# Patient Record
Sex: Male | Born: 1935 | Race: White | Hispanic: No | Marital: Married | Smoking: Never smoker
Health system: Southern US, Community
[De-identification: ages and names within clinical notes are randomized; demographics above are authoritative.]

## PROBLEM LIST (undated history)

## (undated) DIAGNOSIS — E669 Obesity, unspecified: Secondary | ICD-10-CM

## (undated) DIAGNOSIS — E785 Hyperlipidemia, unspecified: Secondary | ICD-10-CM

## (undated) DIAGNOSIS — I10 Essential (primary) hypertension: Secondary | ICD-10-CM

## (undated) DIAGNOSIS — R7989 Other specified abnormal findings of blood chemistry: Secondary | ICD-10-CM

## (undated) HISTORY — DX: Obesity, unspecified: E66.9

## (undated) HISTORY — PX: KNEE SURGERY: SHX244

## (undated) HISTORY — PX: APPENDECTOMY: SHX54

## (undated) HISTORY — DX: Hyperlipidemia, unspecified: E78.5

## (undated) HISTORY — DX: Other specified abnormal findings of blood chemistry: R79.89

## (undated) HISTORY — DX: Essential (primary) hypertension: I10

---

## 2009-06-21 ENCOUNTER — Encounter: Payer: Self-pay | Admitting: Cardiology

## 2010-02-05 ENCOUNTER — Encounter: Payer: Self-pay | Admitting: Cardiology

## 2010-02-28 DIAGNOSIS — R519 Headache, unspecified: Secondary | ICD-10-CM | POA: Insufficient documentation

## 2010-02-28 DIAGNOSIS — R5383 Other fatigue: Secondary | ICD-10-CM | POA: Insufficient documentation

## 2010-02-28 DIAGNOSIS — R51 Headache: Secondary | ICD-10-CM | POA: Insufficient documentation

## 2010-02-28 DIAGNOSIS — R4182 Altered mental status, unspecified: Secondary | ICD-10-CM | POA: Insufficient documentation

## 2010-02-28 DIAGNOSIS — R42 Dizziness and giddiness: Secondary | ICD-10-CM | POA: Insufficient documentation

## 2010-03-01 ENCOUNTER — Ambulatory Visit: Payer: Self-pay | Admitting: Cardiology

## 2010-03-01 DIAGNOSIS — M542 Cervicalgia: Secondary | ICD-10-CM | POA: Insufficient documentation

## 2010-03-01 DIAGNOSIS — E669 Obesity, unspecified: Secondary | ICD-10-CM

## 2010-03-01 DIAGNOSIS — E663 Overweight: Secondary | ICD-10-CM | POA: Insufficient documentation

## 2010-03-01 DIAGNOSIS — I10 Essential (primary) hypertension: Secondary | ICD-10-CM

## 2010-07-13 NOTE — Assessment & Plan Note (Signed)
Summary: np6/weak spells/eval for treadmill & echo/jml   Visit Type:  Initial Consult Primary Provider:  Dr. Barrie Lyme  CC:  Neck Pain.  History of Present Illness: The the patient this patient of neck discomfort. He saw his primary doctor and discuss some vague discomfort in his right posterior neck. This it was a pulling  kind of tired sensation. He couldn't quantify or qualify it. He had a friend tell him that if his fingers became numb he should become concerned and he did have some tingling in his right fifth and fourth digits. However, none of this was reproducible. He has not had any chest pressure. He has had no associated symptoms such as nausea vomiting or diaphoresis. He has no shortness of breath and denies any PND or orthopnea. He denies any palpitations, presyncope or syncope. He says that he is somewhat active running a chain saw and hunting. With this level of activity he cannot bring on any symptoms. He says he had a treadmill test some years ago but no other cardiovascular testing.  Preventive Screening-Counseling & Management  Alcohol-Tobacco     Smoking Status: quit     Year Quit: 1970  Current Medications (verified): 1)  Niaspan 500 Mg Cr-Tabs (Niacin (Antihyperlipidemic)) .... Take 2 Tablet By Mouth Once A Day 2)  Lisinopril 20 Mg Tabs (Lisinopril) .... Take 1 Tablet By Mouth Once A Day 3)  Avapro 300 Mg Tabs (Irbesartan) .... Take 1 Tablet By Mouth Once A Day 4)  Androgel 50 Mg/5gm Gel (Testosterone) .... Use As Directed 5)  Paxil 20 Mg Tabs (Paroxetine Hcl) .... Take 1 Tablet By Mouth Once A Day 6)  Fish Oil 1000 Mg Caps (Omega-3 Fatty Acids) .... Take 1 Tablet By Mouth Two Times A Day  Allergies (verified): No Known Drug Allergies  Comments:  Nurse/Medical Assistant: The patient's medication list and allergies were reviewed with the patient and were updated in the Medication and Allergy Lists.  Past History:  Past Medical History: Hyperlipidemia x 4  years Hypertension x 3 years Low testosterone  Past Surgical History: None  Family History: Father died from CA, MI age 32? Mother died from age related problems No early heart disease Brother with for AAA  Social History: Retired  Married  Quit smoking in  1970 Smoking Status:  quit  Review of Systems       As stated in the HPI and negative for all other systems.   Vital Signs:  Patient profile:   75 year old male Height:      72 inches Weight:      254 pounds BMI:     34.57 Pulse rate:   65 / minute BP sitting:   132 / 82  (left arm) Cuff size:   large  Vitals Entered By: Carlye Grippe (March 01, 2010 9:12 AM)  Nutrition Counseling: Patient's BMI is greater than 25 and therefore counseled on weight management options.  Physical Exam  General:  Well developed, well nourished, in no acute distress. Head:  normocephalic and atraumatic Eyes:  PERRLA/EOM intact; conjunctiva and lids normal. Mouth:  Teeth, gums and palate normal. Oral mucosa normal. Neck:  Neck supple, no JVD. No masses, thyromegaly or abnormal cervical nodes. Chest Wall:  no deformities  Lungs:  Clear bilaterally to auscultation and percussion. Abdomen:  Bowel sounds positive; abdomen soft and non-tender without masses, organomegaly, or hernias noted. No hepatosplenomegaly. Msk:  Back normal, normal gait. Muscle strength and tone normal. Extremities:  No clubbing or  cyanosis. Neurologic:  Alert and oriented x 3. Skin:  Intact without lesions or rashes. Cervical Nodes:  no significant adenopathy Axillary Nodes:  no significant adenopathy Psych:  Normal affect.   Detailed Cardiovascular Exam  Neck    Carotids: Carotids full and equal bilaterally without bruits.      Neck Veins: Normal, no JVD.    Heart    Inspection: no deformities or lifts noted.      Palpation: normal PMI with no thrills palpable.      Auscultation: regular rate and rhythm, S1, S2 without murmurs, rubs, gallops, or  clicks.    Vascular    Abdominal Aorta: no palpable masses, pulsations, or audible bruits.      Femoral Pulses: normal femoral pulses bilaterally.      Pedal Pulses: normal pedal pulses bilaterally.      Radial Pulses: normal radial pulses bilaterally.      Peripheral Circulation: no clubbing, cyanosis, or edema noted with normal capillary refill.     EKG  Procedure date:  02/05/2010  Findings:      Sinus rhythm, rate 63, borderline interventricular conduction delay, left axis deviation, left anterior fascicular block, no acute ST-T wave changes, no old EKGs for comparison  Impression & Recommendations:  Problem # 1:  NECK PAIN (ICD-723.1) The patient's pain was somewhat atypical and vague. He does have cardiovascular risk factors but no other objective findings vascular disease. At this point I would screen him with an exercise treadmill test. Further evaluation can be based on these results.  Problem # 2:  OBESITY, UNSPECIFIED (ICD-278.00) head along discussion with him about the need to lose weight with diet and exercise. I gave specifics on both.  Problem # 3:  HYPERTENSION, MILD (ICD-401.1) His blood pressure is controlled and he will continue the meds as listed.  Patient Instructions: 1)  GXT with Dr. Kathi Der office as planned. 2)  No further work up needed in our office.

## 2010-07-13 NOTE — Letter (Signed)
Summary: External Correspondence/ [facility]  External Correspondence/ [facility]   Imported By: Dorise Hiss 02/09/2010 10:49:21  _____________________________________________________________________  External Attachment:    Type:   Image     Comment:   External Document

## 2012-05-20 ENCOUNTER — Encounter: Payer: Self-pay | Admitting: Cardiovascular Disease

## 2012-05-20 ENCOUNTER — Encounter: Payer: Self-pay | Admitting: *Deleted

## 2012-05-22 ENCOUNTER — Ambulatory Visit: Payer: Self-pay | Admitting: Cardiovascular Disease

## 2012-10-03 ENCOUNTER — Encounter: Payer: Self-pay | Admitting: Family Medicine

## 2012-10-03 ENCOUNTER — Ambulatory Visit (INDEPENDENT_AMBULATORY_CARE_PROVIDER_SITE_OTHER): Payer: Medicare HMO

## 2012-10-03 ENCOUNTER — Ambulatory Visit (INDEPENDENT_AMBULATORY_CARE_PROVIDER_SITE_OTHER): Payer: Medicare HMO | Admitting: Family Medicine

## 2012-10-03 VITALS — BP 139/85 | HR 67 | Temp 97.9°F | Ht 73.0 in | Wt 258.2 lb

## 2012-10-03 DIAGNOSIS — M25529 Pain in unspecified elbow: Secondary | ICD-10-CM

## 2012-10-03 DIAGNOSIS — S59919A Unspecified injury of unspecified forearm, initial encounter: Secondary | ICD-10-CM

## 2012-10-03 DIAGNOSIS — S59902A Unspecified injury of left elbow, initial encounter: Secondary | ICD-10-CM

## 2012-10-03 DIAGNOSIS — Z23 Encounter for immunization: Secondary | ICD-10-CM

## 2012-10-03 DIAGNOSIS — M25522 Pain in left elbow: Secondary | ICD-10-CM

## 2012-10-03 DIAGNOSIS — S59909A Unspecified injury of unspecified elbow, initial encounter: Secondary | ICD-10-CM

## 2012-10-03 NOTE — Patient Instructions (Addendum)
We will continue to observe left elbow Return to clinic sooner if any worsening problem Otherwise recheck him in one week

## 2012-10-03 NOTE — Progress Notes (Signed)
  Subjective:    Patient ID: Nicholas Hawkins, male    DOB: 1936-03-25, 77 y.o.   MRN: [id number]  HPI Patient fell on left elbow 5 days ago. He elbow hit a piece of wood. It remains very sensitive since this injury as if there was a splinter still in the elbow.  Review of Systems  Musculoskeletal: Positive for arthralgias ( L elbow after fall, feels like piece of wood inside).   Last tetanus shot was 2004.    Objective:   Physical Exam The left elbow has good mobility. There does not appear to be any swelling. There is no erythema. There is no rubor. With gentle touch to the elbow, he seems to be very sensitive.  [facility] reading (PRIMARY) by  Dr. Christell Constant: No sign of a foreign body. Elbow appears negative,                                  Assessment & Plan:  1. Left elbow pain - DG Elbow 2 Views Left  2. Need for Tdap vaccination - Tdap vaccine greater than or equal to 7yo IM  3. Elbow injury, left, initial encounter

## 2012-10-08 ENCOUNTER — Ambulatory Visit (INDEPENDENT_AMBULATORY_CARE_PROVIDER_SITE_OTHER): Payer: Medicare HMO | Admitting: Family Medicine

## 2012-10-18 ENCOUNTER — Other Ambulatory Visit: Payer: Self-pay

## 2012-10-18 MED ORDER — PAROXETINE HCL 20 MG PO TABS: 20.0000 mg | ORAL_TABLET | ORAL | Status: DC

## 2012-10-18 MED ORDER — LISINOPRIL 20 MG PO TABS: 20.0000 mg | ORAL_TABLET | Freq: Every day | ORAL | Status: DC

## 2012-11-06 ENCOUNTER — Other Ambulatory Visit: Payer: Self-pay | Admitting: *Deleted

## 2012-11-06 MED ORDER — TESTOSTERONE 20.25 MG/1.25GM (1.62%) TD GEL: 1.0000 "application " | Freq: Every day | TRANSDERMAL | Status: DC

## 2012-11-06 NOTE — Telephone Encounter (Signed)
Last seen 07/22/12, if approved have Nicholas Hawkins. Call into [facility]

## 2012-11-13 ENCOUNTER — Encounter: Payer: Medicare HMO | Admitting: Family Medicine

## 2012-11-20 ENCOUNTER — Ambulatory Visit (INDEPENDENT_AMBULATORY_CARE_PROVIDER_SITE_OTHER): Payer: Medicare HMO | Admitting: Nurse Practitioner

## 2012-11-20 ENCOUNTER — Encounter: Payer: Self-pay | Admitting: Nurse Practitioner

## 2012-11-20 VITALS — BP 133/78 | HR 66 | Temp 98.4°F | Ht 72.5 in | Wt 252.0 lb

## 2012-11-20 DIAGNOSIS — R5381 Other malaise: Secondary | ICD-10-CM

## 2012-11-20 LAB — COMPLETE METABOLIC PANEL WITH GFR
AST: 20 U/L (ref 0–37)
Alkaline Phosphatase: 107 U/L (ref 39–117)
BUN: 18 mg/dL (ref 6–23)
Calcium: 9.6 mg/dL (ref 8.4–10.5)
Creat: 0.86 mg/dL (ref 0.50–1.35)
Total Bilirubin: 0.5 mg/dL (ref 0.3–1.2)

## 2012-11-20 LAB — POCT CBC
Granulocyte percent: 76.8 %G (ref 37–80)
Lymph, poc: 1.7 (ref 0.6–3.4)
MPV: 6.9 fL (ref 0–99.8)
POC Granulocyte: 6.8 (ref 2–6.9)
POC LYMPH PERCENT: 19.3 %L (ref 10–50)
Platelet Count, POC: 344 10*3/uL (ref 142–424)
RDW, POC: 13.2 %
WBC: 8.9 10*3/uL (ref 4.6–10.2)

## 2012-11-20 NOTE — Patient Instructions (Addendum)
Deer Tick Bite Deer ticks are brown arachnids (spider family) that vary in size from as small as the head of a pin to 1/4 inch (1/2 cm) diameter. They thrive in wooded areas. Deer are the preferred host of adult deer ticks. Small rodents are the host of young ticks (nymphs). When a person walks in a field or wooded area, young and adult ticks in the surrounding grass and vegetation can attach themselves to the skin. They can suck blood for hours to days if unnoticed. Ticks are found all over the [place] Some ticks carry a specific bacteria (Borrelia burgdorferi) that causes an infection called Lyme disease. The bacteria is typically passed into a person during the blood sucking process. This happens after the tick has been attached for at least a number of hours. While ticks can be found all over the [place] those carrying the bacteria that causes Lyme disease are most common in [place] and the [place]. Only a small proportion of ticks in these areas carry the Lyme disease bacteria and cause human infections. Ticks usually attach to warm spots on the body, such as the:  Head.  Back.  Neck.  Armpits.  Groin. SYMPTOMS  Most of the time, a deer tick bite will not be felt. You may or may not see the attached tick. You may notice mild irritation or redness around the bite site. If the deer tick passes the Lyme disease bacteria to a person, a round, red rash may be noticed 2 to 3 days after the bite. The rash may be clear in the middle, like a bull's-eye or target. If not treated, other symptoms may develop several days to weeks after the onset of the rash. These symptoms may include:  New rash lesions.  Fatigue and weakness.  General ill feeling and achiness.  Chills.  Headache and neck pain.  Swollen lymph glands.  Sore muscles and joints. 5 to 15% of untreated people with Lyme disease may develop more severe illnesses after several weeks to months. This may include inflammation of the  brain lining (meningitis), nerve palsies, an abnormal heartbeat, or severe muscle and joint pain and inflammation (myositis or arthritis). DIAGNOSIS   Physical exam and medical history.  Viewing the tick if it was saved for confirmation.  Blood tests (to check or confirm the presence of Lyme disease). TREATMENT  Most ticks do not carry disease. If found, an attached tick should be removed using tweezers. Tweezers should be placed under the body of the tick so it is removed by its attachment parts (pincers). If there are signs or symptoms of being sick, or Lyme disease is confirmed, medicines (antibiotics) that kill germs are usually prescribed. In more severe cases, antibiotics may be given through an intravenous (IV) access. HOME CARE INSTRUCTIONS   Always remove ticks with tweezers. Do not use petroleum jelly or other methods to kill or remove the tick. Slide the tweezers under the body and pull out as much as you can. If you are not sure what it is, save it in a jar and show your caregiver.  Once you remove the tick, the skin will heal on its own. Wash your hands and the affected area with water and soap. You may place a bandage on the affected area.  Take medicine as directed. You may be advised to take a full course of antibiotics.  Follow up with your caregiver as recommended. FINDING OUT THE RESULTS OF YOUR TEST Not all test results are available   during your visit. If your test results are not back during the visit, make an appointment with your caregiver to find out the results. Do not assume everything is normal if you have not heard from your caregiver or the medical facility. It is important for you to follow up on all of your test results. PROGNOSIS  If Lyme disease is confirmed, early treatment with antibiotics is very effective. Following preventive guidelines is important since it is possible to get the disease more than once. PREVENTION   Wear long sleeves and long pants in  wooded or grassy areas. Tuck your pants into your socks.  Use an insect repellent while hiking.  Check yourself, your children, and your pets regularly for ticks after playing outside.  Clear piles of leaves or brush from your yard. Ticks might live there. SEEK MEDICAL CARE IF:   You or your child has an oral temperature above 102 F (38.9 C).  You develop a severe headache following the bite.  You feel generally ill.  You notice a rash.  You are having trouble removing the tick.  The bite area has red skin or yellow drainage. SEEK IMMEDIATE MEDICAL CARE IF:   Your face is weak and droopy or you have other neurological symptoms.  You have severe joint pain or weakness. MAKE SURE YOU:   Understand these instructions.  Will watch your condition.  Will get help right away if you are not doing well or get worse. FOR MORE INFORMATION Centers for Disease Control and Prevention: www.cdc.gov American Academy of [employer] Physicians: www.aafp.org Document Released: 08/23/2009 Document Revised: 08/21/2011 Document Reviewed: 08/23/2009 Exit[employer] Patient Information 2014 [employer].  

## 2012-11-20 NOTE — Progress Notes (Signed)
  Subjective:    Patient ID: Nicholas Hawkins, male    DOB: [birth year], 77 y.o.   MRN: [id number]  HPI. Pt admitted to hospital Saturday. Pt reports that hospital stated "it could have been food poision". Discharged from hospital Monday AM. PT still c/o of fatigue. Pt states he has intermittent hot flashes followed by chills. Pt states he has not taken anything and nothing makes it worse or better.    * Patient also had a tick bite - gets off of hisself everyday.  Review of Systems  Constitutional: Positive for chills and fatigue.  HENT: Positive for congestion.   All other systems reviewed and are negative.       Objective:   Physical Exam  Constitutional: He is oriented to person, place, and time. He appears well-developed and well-nourished.  HENT:  Head: Normocephalic.  Eyes: Pupils are equal, round, and reactive to light.  Neck: Normal range of motion. Neck supple. No thyromegaly present.  Cardiovascular: Normal rate, regular rhythm and normal heart sounds.   Pulmonary/Chest: Effort normal and breath sounds normal.  Abdominal: Soft. Bowel sounds are normal. There is no tenderness. There is no rebound.  Musculoskeletal: Normal range of motion. He exhibits no edema and no tenderness.  Neurological: He is alert and oriented to person, place, and time.  Skin: Skin is warm and dry.  Psychiatric: He has a normal mood and affect. His behavior is normal. Judgment and thought content normal.   BP 133/78  Pulse 66  Temp(Src) 98.4 F (36.9 C) (Oral)  Ht 6' 0.5" (1.842 m)  Wt 252 lb (114.306 kg)  BMI 33.69 kg/m2        Assessment & Plan:  1. Other malaise and fatigue Labs pending Force fluids - COMPLETE METABOLIC PANEL WITH GFR - POCT CBC - Rocky mtn spotted fvr abs pnl(IgG+IgM) - B. burgdorfi antibodies  Mary-Margaret Daphine Deutscher, FNP

## 2012-11-22 LAB — ROCKY MTN SPOTTED FVR ABS PNL(IGG+IGM)
RMSF IgG: 0.65 IV
RMSF IgM: 0.5 IV

## 2012-11-27 ENCOUNTER — Ambulatory Visit: Payer: Medicare HMO | Admitting: Physician Assistant

## 2012-12-04 ENCOUNTER — Ambulatory Visit (INDEPENDENT_AMBULATORY_CARE_PROVIDER_SITE_OTHER): Payer: Medicare PPO | Admitting: Cardiology

## 2012-12-04 ENCOUNTER — Encounter: Payer: Self-pay | Admitting: Cardiology

## 2012-12-04 VITALS — BP 142/77 | HR 77 | Ht 73.0 in | Wt 256.0 lb

## 2012-12-04 DIAGNOSIS — R079 Chest pain, unspecified: Secondary | ICD-10-CM

## 2012-12-04 DIAGNOSIS — R9431 Abnormal electrocardiogram [ECG] [EKG]: Secondary | ICD-10-CM

## 2012-12-04 DIAGNOSIS — I10 Essential (primary) hypertension: Secondary | ICD-10-CM

## 2012-12-04 NOTE — Progress Notes (Signed)
HPI The patient presents for evaluation of chest pain and an abnormal EKG. He was noted a few years ago to have borderline QRS widening with left anterior fascicular block. EKG now has progressed to an interventricular conduction delay with the axis unchanged. He was recently seen in the emergency room with what he thinks was food poisoning. He had a murmur that sat out for 3 days. He then had severe nausea and vomiting and went to the emergency room at [facility].  With this he had chest discomfort that was more musculoskeletal and related to the vomiting. Once this improved he has not had any other symptoms. He says he is quite active. With his activities he denies any chest pressure, neck or arm discomfort. He doesn't have any shortness of breath, PND or or orthopnea. He has no palpitations, presyncope or syncope. He has no swelling weight gain or edema. He admits to very poor eating habits.  No Known Allergies  Current Outpatient Prescriptions  Medication Sig Dispense Refill  . lisinopril (PRINIVIL,ZESTRIL) 20 MG tablet Take 1 tablet (20 mg total) by mouth daily.  30 tablet  1  . PARoxetine (PAXIL) 20 MG tablet Take 1 tablet (20 mg total) by mouth every morning.  30 tablet  1  . Testosterone (ANDROGEL) 20.25 MG/1.25GM (1.62%) GEL Place 1 application onto the skin daily. Each shoulder  1.25 g  0   No current facility-administered medications for this visit.    Past Medical History  Diagnosis Date  . Obesity, unspecified   . HYPERTENSION, MILD   . NECK PAIN   . Dizziness and giddiness   . FATIGUE   . Altered mental status   . [id number](478.2)     Past Surgical History  Procedure Laterality Date  . Appendectomy      ROS:  As stated in the HPI and negative for all other systems.  PHYSICAL EXAM BP 142/77  Pulse 77  Ht 6\' 1"  (1.854 m)  Wt 256 lb (116.121 kg)  BMI 33.78 kg/m2 GENERAL:  Well appearing HEENT:  Pupils equal round and reactive, fundi not visualized, oral mucosa  unremarkable NECK:  No jugular venous distention, waveform within normal limits, carotid upstroke brisk and symmetric, no bruits, no thyromegaly LYMPHATICS:  No cervical, inguinal adenopathy LUNGS:  Clear to auscultation bilaterally BACK:  No CVA tenderness CHEST:  Unremarkable HEART:  PMI not displaced or sustained,S1 and S2 within normal limits, no S3, no S4, no clicks, no rubs, no murmurs ABD:  Flat, positive bowel sounds normal in frequency in pitch, no bruits, no rebound, no guarding, no midline pulsatile mass, no hepatomegaly, no splenomegaly, obese EXT:  2 plus pulses throughout, no edema, no cyanosis no clubbing SKIN:  No rashes no nodules NEURO:  Cranial nerves II through XII grossly intact, motor grossly intact throughout PSYCH:  Cognitively intact, oriented to person place and time   EKG:  Sinus rhythm, rate 72, interventricular conduction delay, left axis deviation, no change from previous.  ASSESSMENT AND PLAN  ABNORMAL EKG:  Although the QRS is slightly wider than before I don't think this represents a significant change. It is probably related to hypertension. Otherwise not suspect structural heart disease. He has no other symptoms to suggest significant cardiac pathology. No further workup is suggested.  CHEST PAIN:  He has no symptoms since getting over his bout of vomiting. No further workup is suggested.  OBESITY:  We had a long discussion about this.The patient understands the need to lose weight with  diet and exercise. We have discussed specific strategies for this.  HTN:  The blood pressure is at target. No change in medications is indicated. We will continue with therapeutic lifestyle changes (TLC).

## 2012-12-04 NOTE — Patient Instructions (Addendum)
The current medical regimen is effective;  continue present plan and medications.  Follow up as needed 

## 2013-01-23 ENCOUNTER — Other Ambulatory Visit: Payer: Self-pay

## 2013-01-23 MED ORDER — LISINOPRIL 20 MG PO TABS: 20.0000 mg | ORAL_TABLET | Freq: Every day | ORAL | Status: DC

## 2013-01-23 MED ORDER — PAROXETINE HCL 20 MG PO TABS: 20.0000 mg | ORAL_TABLET | ORAL | Status: DC

## 2013-01-23 NOTE — Telephone Encounter (Signed)
LAST SEEN 11/20/12  MMM  Last filled 11/06/12    If approved phone in and route to nurse

## 2013-01-29 ENCOUNTER — Other Ambulatory Visit: Payer: Self-pay | Admitting: Physician Assistant

## 2013-01-29 MED ORDER — TESTOSTERONE 20.25 MG/1.25GM (1.62%) TD GEL: 1.0000 "application " | Freq: Every day | TRANSDERMAL | Status: DC

## 2013-01-29 NOTE — Telephone Encounter (Signed)
rx ready for pickup 

## 2013-01-29 NOTE — Telephone Encounter (Signed)
Last seen 11/20/12  MMM  If approved route to nurse to phone in

## 2013-01-31 ENCOUNTER — Telehealth: Payer: Self-pay | Admitting: Family Medicine

## 2013-01-31 NOTE — Telephone Encounter (Signed)
Called into [facility]

## 2013-03-06 ENCOUNTER — Ambulatory Visit (INDEPENDENT_AMBULATORY_CARE_PROVIDER_SITE_OTHER): Payer: Medicare HMO | Admitting: Family Medicine

## 2013-03-06 ENCOUNTER — Encounter: Payer: Self-pay | Admitting: Family Medicine

## 2013-03-06 VITALS — BP 154/86 | HR 66 | Temp 98.3°F | Wt 256.2 lb

## 2013-03-06 DIAGNOSIS — R635 Abnormal weight gain: Secondary | ICD-10-CM

## 2013-03-06 DIAGNOSIS — R202 Paresthesia of skin: Secondary | ICD-10-CM | POA: Insufficient documentation

## 2013-03-06 DIAGNOSIS — E785 Hyperlipidemia, unspecified: Secondary | ICD-10-CM

## 2013-03-06 DIAGNOSIS — E669 Obesity, unspecified: Secondary | ICD-10-CM

## 2013-03-06 DIAGNOSIS — I10 Essential (primary) hypertension: Secondary | ICD-10-CM

## 2013-03-06 DIAGNOSIS — R209 Unspecified disturbances of skin sensation: Secondary | ICD-10-CM

## 2013-03-06 DIAGNOSIS — Z23 Encounter for immunization: Secondary | ICD-10-CM

## 2013-03-06 LAB — POCT GLYCOSYLATED HEMOGLOBIN (HGB A1C): Hemoglobin A1C: 5.5

## 2013-03-06 LAB — POCT CBC
Granulocyte percent: 62.3 %G (ref 37–80)
HCT, POC: 51.8 % (ref 43.5–53.7)
Hemoglobin: 16.9 g/dL (ref 14.1–18.1)
Lymph, poc: 1.9 (ref 0.6–3.4)
MCH, POC: 28.9 pg (ref 27–31.2)
MCHC: 32.7 g/dL (ref 31.8–35.4)
MCV: 88.5 fL (ref 80–97)
MPV: 7.5 fL (ref 0–99.8)
POC Granulocyte: 3.9 (ref 2–6.9)
POC LYMPH PERCENT: 30.5 %L (ref 10–50)
Platelet Count, POC: 206 10*3/uL (ref 142–424)
RBC: 5.9 M/uL (ref 4.69–6.13)
RDW, POC: 14.2 %
WBC: 6.3 10*3/uL (ref 4.6–10.2)

## 2013-03-06 NOTE — Progress Notes (Signed)
Patient ID: Nicholas Hawkins, male   DOB: 1935/10/07, 77 y.o.   MRN: [id number] SUBJECTIVE: CC: Chief Complaint  Patient presents with  . Follow-up     wants flu shot . states fingers go numb when driving. c/o toes feel numb   . Medication Refill    refill  androgel and paxil    HPI:  When he drives and has his left elbow on th ewindow , his left pinky finger and ring finger tingles. Also his feet tingles on and off. Concerned that he may be diabetic. Though no other DM symptoms. "Eats badly and has a big belly" Patient is here for follow up of hypertension: denies Headache;deniesChest Pain;denies weakness;denies Shortness of Breath or Orthopnea;denies Visual changes;denies palpitations;denies cough;denies pedal edema;denies symptoms of TIA or stroke; admits to Compliance with medications. denies Problems with medications. Didn't take his medications this am.thats why his BP is a bit up. Usually under good control.  Past Medical History  Diagnosis Date  . Obesity, unspecified   . HYPERTENSION, MILD    Past Surgical History  Procedure Laterality Date  . Appendectomy     History   Social History  . Marital Status: Married    Spouse Name: N/A    Number of Children: N/A  . Years of Education: N/A   Occupational History  . Not on file.   Social History Main Topics  . Smoking status: Never Smoker   . Smokeless tobacco: Not on file  . Alcohol Use: No  . Drug Use: No  . Sexual Activity: Not on file   Other Topics Concern  . Not on file   Social History Narrative  . No narrative on file   Family History  Problem Relation Age of Onset  . Heart disease Mother    Current Outpatient Prescriptions on File Prior to Visit  Medication Sig Dispense Refill  . PARoxetine (PAXIL) 20 MG tablet Take 1 tablet (20 mg total) by mouth every morning.  30 tablet  1  . Testosterone (ANDROGEL) 20.25 MG/1.25GM (1.62%) GEL Place 1 application onto the skin daily. Each shoulder  1.25 g  0  .  lisinopril (PRINIVIL,ZESTRIL) 20 MG tablet Take 1 tablet (20 mg total) by mouth daily.  30 tablet  4   No current facility-administered medications on file prior to visit.   No Known Allergies Immunization History  Administered Date(s) Administered  . Tdap 10/03/2012   Prior to Admission medications   Medication Sig Start Date End Date Taking? Authorizing Provider  PARoxetine (PAXIL) 20 MG tablet Take 1 tablet (20 mg total) by mouth every morning. 01/23/13  Yes Mary-Margaret Daphine Deutscher, FNP  Testosterone (ANDROGEL) 20.25 MG/1.25GM (1.62%) GEL Place 1 application onto the skin daily. Each shoulder 01/29/13  Yes Mary-Margaret Daphine Deutscher, FNP  lisinopril (PRINIVIL,ZESTRIL) 20 MG tablet Take 1 tablet (20 mg total) by mouth daily. 01/23/13   Mary-Margaret Daphine Deutscher, FNP    ROS: As above in the HPI. All other systems are stable or negative.  OBJECTIVE: APPEARANCE:  Patient in no acute distress.The patient appeared well nourished and normally developed. Acyanotic. Waist: VITAL SIGNS:BP 154/86  Pulse 66  Temp(Src) 98.3 F (36.8 C) (Oral)  Wt 256 lb 3.2 oz (116.212 kg)  BMI 33.81 kg/m2 WM Obese  SKIN: warm and  Dry without overt rashes, tattoos and scars  HEAD and Neck: without JVD, Head and scalp: normal Eyes:No scleral icterus. Fundi normal, eye movements normal. Ears: Auricle normal, canal normal, Tympanic membranes normal, insufflation normal. Nose: normal Throat:  normal Neck & thyroid: normal  CHEST & LUNGS: Chest wall: normal Lungs: Clear  CVS: Reveals the PMI to be normally located. Regular rhythm, First and Second Heart sounds are normal,  absence of murmurs, rubs or gallops. Peripheral vasculature: Radial pulses: normal Dorsal pedis pulses: normal Posterior pulses: normal  ABDOMEN:  Appearance: obese Benign, no organomegaly, no masses, no Abdominal Aortic enlargement. No Guarding , no rebound. No Bruits. Bowel sounds: normal  RECTAL: N/A GU: N/A  EXTREMETIES:  nonedematous.  MUSCULOSKELETAL:  Spine: normal Joints: intact  NEUROLOGIC: oriented to time,place and person; nonfocal. Strength is normal Sensory is abnormal: of the feet. Some of the toes reduced sensory to the monofilament test. Able to reproduce the left Ulnar nerve neuritis by massaging the left ulnar nerve in th eulnar groove at the elbow. Reflexes are normal Cranial Nerves are normal.  ASSESSMENT: Paresthesias - hands and feet - Plan: POCT CBC, CMP14+EGFR, TSH, POCT glycosylated hemoglobin (Hb A1C), Folate, Vitamin B12, ANA, RPR  HYPERTENSION, MILD - Plan: CMP14+EGFR  Obesity, unspecified  Dyslipidemia - Plan: NMR, lipoprofile   PLAN: Orders Placed This Encounter  Procedures  . CMP14+EGFR  . NMR, lipoprofile  . TSH  . Folate  . Vitamin B12  . ANA  . RPR  . POCT CBC  . POCT glycosylated hemoglobin (Hb A1C)    Await labs. Pending labs , consider NCS.  Weight reduction and exercise. Compliance with medication.  Return in about 4 weeks (around 04/03/2013) for Recheck medical problems.  Janelle Spellman P. [place]esto Charon, M.D.

## 2013-03-08 LAB — CMP14+EGFR
ALT: 20 IU/L (ref 0–44)
AST: 18 IU/L (ref 0–40)
Albumin/Globulin Ratio: 2.1 (ref 1.1–2.5)
Albumin: 4.7 g/dL (ref 3.5–4.8)
Alkaline Phosphatase: 111 IU/L (ref 39–117)
BUN/Creatinine Ratio: 14 (ref 10–22)
BUN: 13 mg/dL (ref 8–27)
CO2: 27 mmol/L (ref 18–29)
Calcium: 9.3 mg/dL (ref 8.6–10.2)
Chloride: 99 mmol/L (ref 97–108)
Creatinine, Ser: 0.9 mg/dL (ref 0.76–1.27)
GFR calc Af Amer: 96 mL/min/{1.73_m2} (ref >59)
GFR calc non Af Amer: 83 mL/min/{1.73_m2} (ref >59)
Globulin, Total: 2.2 g/dL (ref 1.5–4.5)
Glucose: 88 mg/dL (ref 65–99)
Potassium: 5.1 mmol/L (ref 3.5–5.2)
Sodium: 143 mmol/L (ref 134–144)
Total Bilirubin: 0.4 mg/dL (ref 0.0–1.2)
Total Protein: 6.9 g/dL (ref 6.0–8.5)

## 2013-03-08 LAB — RPR: RPR: NONREACTIVE

## 2013-03-08 LAB — ANA: Anti Nuclear Antibody(ANA): NEGATIVE

## 2013-03-08 LAB — NMR, LIPOPROFILE
Cholesterol: 176 mg/dL (ref <200)
HDL Cholesterol by NMR: 23 mg/dL — ABNORMAL LOW (ref >=40)
HDL Particle Number: 21.7 umol/L — ABNORMAL LOW (ref >=30.5)
LDL Particle Number: 1967 nmol/L — ABNORMAL HIGH (ref <1000)
LDL Size: 19.6 nm — ABNORMAL LOW (ref >20.5)
LDLC SERPL CALC-MCNC: 93 mg/dL (ref <100)
LP-IR Score: 76 — ABNORMAL HIGH (ref <=45)
Small LDL Particle Number: 1652 nmol/L — ABNORMAL HIGH (ref <=527)
Triglycerides by NMR: 300 mg/dL — ABNORMAL HIGH (ref <150)

## 2013-03-08 LAB — FOLATE: Folate: 11.1 ng/mL (ref >3.0)

## 2013-03-08 LAB — TSH: TSH: 2.13 u[IU]/mL (ref 0.450–4.500)

## 2013-03-08 LAB — VITAMIN B12: Vitamin B-12: 260 pg/mL (ref 211–946)

## 2013-03-09 ENCOUNTER — Other Ambulatory Visit: Payer: Self-pay | Admitting: Family Medicine

## 2013-03-09 DIAGNOSIS — R202 Paresthesia of skin: Secondary | ICD-10-CM

## 2013-03-09 NOTE — Progress Notes (Signed)
Quick Note:  Labs abnormal. Lipids are too high. Needs to see Tammy or Marcelino Duster for diet education and medication intiation for dyslipidemia.  The rest of the labs were fine. Did not reveal the cause for the numbness of the hands and feet.  I will refer to neurology.  ______

## 2013-03-17 ENCOUNTER — Ambulatory Visit: Payer: Medicare HMO | Admitting: Neurology

## 2013-04-07 ENCOUNTER — Ambulatory Visit: Payer: Medicare HMO | Admitting: Family Medicine

## 2013-04-10 ENCOUNTER — Ambulatory Visit: Payer: Medicare HMO | Admitting: Neurology

## 2013-07-07 ENCOUNTER — Other Ambulatory Visit: Payer: Medicare HMO

## 2013-07-14 ENCOUNTER — Encounter: Payer: Self-pay | Admitting: Family Medicine

## 2013-07-14 ENCOUNTER — Ambulatory Visit (INDEPENDENT_AMBULATORY_CARE_PROVIDER_SITE_OTHER): Payer: Medicare HMO | Admitting: Family Medicine

## 2013-07-14 VITALS — BP 151/85 | HR 64 | Temp 98.5°F | Ht 72.0 in | Wt 249.4 lb

## 2013-07-14 DIAGNOSIS — E785 Hyperlipidemia, unspecified: Secondary | ICD-10-CM

## 2013-07-14 DIAGNOSIS — H9190 Unspecified hearing loss, unspecified ear: Secondary | ICD-10-CM

## 2013-07-14 DIAGNOSIS — R209 Unspecified disturbances of skin sensation: Secondary | ICD-10-CM

## 2013-07-14 DIAGNOSIS — E669 Obesity, unspecified: Secondary | ICD-10-CM

## 2013-07-14 DIAGNOSIS — Z23 Encounter for immunization: Secondary | ICD-10-CM

## 2013-07-14 DIAGNOSIS — R202 Paresthesia of skin: Secondary | ICD-10-CM

## 2013-07-14 DIAGNOSIS — R7989 Other specified abnormal findings of blood chemistry: Secondary | ICD-10-CM | POA: Insufficient documentation

## 2013-07-14 DIAGNOSIS — I10 Essential (primary) hypertension: Secondary | ICD-10-CM

## 2013-07-14 LAB — POCT UA - MICROSCOPIC ONLY
Casts, Ur, LPF, POC: NEGATIVE
Crystals, Ur, HPF, POC: NEGATIVE
Mucus, UA: NEGATIVE
Yeast, UA: NEGATIVE

## 2013-07-14 LAB — POCT URINALYSIS DIPSTICK
Bilirubin, UA: NEGATIVE
Glucose, UA: NEGATIVE
Ketones, UA: NEGATIVE
Leukocytes, UA: NEGATIVE
Nitrite, UA: NEGATIVE
Protein, UA: NEGATIVE
Spec Grav, UA: 1.025
Urobilinogen, UA: NEGATIVE
pH, UA: 6

## 2013-07-14 MED ORDER — LISINOPRIL 30 MG PO TABS: 30.0000 mg | ORAL_TABLET | Freq: Every day | ORAL | Status: DC

## 2013-07-14 NOTE — Progress Notes (Signed)
Patient ID: Nicholas Hawkins, male   DOB: July 25, 1935, 78 y.o.   MRN: [id number] SUBJECTIVE: CC: Chief Complaint  Patient presents with  . Follow-up    refills c/o cold syptoms and thinks bug flew in left ear    HPI:  Patient is here for follow up of hyperlipidemia/HTN/Obesity: denies Headache;denies Chest Pain;denies weakness;denies Shortness of Breath and orthopnea;denies Visual changes;denies palpitations;denies cough;denies pedal edema;denies symptoms of TIA or stroke;deniesClaudication symptoms. admits to Compliance with medications; denies Problems with medications. Losing weight with dietary changes.   Wants left ear checked was outdoors and felt something went into ears. Has a sensation residual.  Hearing loss. Has been gradual. Has appointment later today with the audiologist.   Past Medical History  Diagnosis Date  . Obesity, unspecified   . HYPERTENSION, MILD   . Hyperlipidemia   . Low testosterone   . Obesity    Past Surgical History  Procedure Laterality Date  . Appendectomy     History   Social History  . Marital Status: Married    Spouse Name: N/A    Number of Children: N/A  . Years of Education: N/A   Occupational History  . Not on file.   Social History Main Topics  . Smoking status: Never Smoker   . Smokeless tobacco: Not on file  . Alcohol Use: No  . Drug Use: No  . Sexual Activity: Not on file   Other Topics Concern  . Not on file   Social History Narrative  . No narrative on file   Family History  Problem Relation Age of Onset  . Heart disease Mother    Current Outpatient Prescriptions on File Prior to Visit  Medication Sig Dispense Refill  . PARoxetine (PAXIL) 20 MG tablet Take 1 tablet (20 mg total) by mouth every morning.  30 tablet  1  . Testosterone (ANDROGEL) 20.25 MG/1.25GM (1.62%) GEL Place 1 application onto the skin daily. Each shoulder  1.25 g  0   No current facility-administered medications on file prior to visit.    No Known Allergies Immunization History  Administered Date(s) Administered  . Influenza,inj,Quad PF,36+ Mos 03/06/2013  . Pneumococcal Conjugate-13 07/14/2013  . Pneumococcal-Unspecified 06/12/2005  . Tdap 10/03/2012   Prior to Admission medications   Medication Sig Start Date End Date Taking? Authorizing Provider  lisinopril (PRINIVIL,ZESTRIL) 20 MG tablet Take 1 tablet (20 mg total) by mouth daily. 01/23/13   Mary-Margaret Hassell Done, FNP  PARoxetine (PAXIL) 20 MG tablet Take 1 tablet (20 mg total) by mouth every morning. 01/23/13   Mary-Margaret Hassell Done, FNP  Testosterone (ANDROGEL) 20.25 MG/1.25GM (1.62%) GEL Place 1 application onto the skin daily. Each shoulder 01/29/13   Mary-Margaret Hassell Done, FNP     ROS: As above in the HPI. All other systems are stable or negative.  OBJECTIVE: APPEARANCE:  Patient in no acute distress.The patient appeared well nourished and normally developed. Acyanotic. Waist: VITAL SIGNS:BP 151/85  Pulse 64  Temp(Src) 98.5 F (36.9 C) (Oral)  Ht 6' (1.829 m)  Wt 249 lb 6.4 oz (113.127 kg)  BMI 33.82 kg/m2 WM obese Recheck BP is 148/90  SKIN: warm and  Dry without overt rashes, tattoos and scars  HEAD and Neck: without JVD, Head and scalp: normal Eyes:No scleral icterus. Fundi normal, eye movements normal. Ears: Auricle normal, canal normal, Tympanic membranes normal, insufflation normal. Nose: normal Throat: normal Neck & thyroid: normal  CHEST & LUNGS: Chest wall: normal Lungs: Clear  CVS: Reveals the PMI to be normally located.  Regular rhythm, First and Second Heart sounds are normal,  absence of murmurs, rubs or gallops. Peripheral vasculature: Radial pulses: normal Dorsal pedis pulses: normal Posterior pulses: normal  ABDOMEN:  Appearance: Obese Benign, no organomegaly, no masses, no Abdominal Aortic enlargement. No Guarding , no rebound. No Bruits. Bowel sounds: normal  RECTAL: N/A GU: N/A  EXTREMETIES:  nonedematous.  MUSCULOSKELETAL:  Spine: normal Joints: intact  NEUROLOGIC: oriented to time,place and person; nonfocal. Strength is normal Sensory is normal Reflexes are normal Cranial Nerves are normal.  Results for orders placed in visit on 03/06/13  CMP14+EGFR      Result Value Range   Glucose 88  65 - 99 mg/dL   BUN 13  8 - 27 mg/dL   Creatinine, Ser 0.90  0.76 - 1.27 mg/dL   GFR calc non Af Amer 83  >59 mL/min/1.73   GFR calc Af Amer 96  >59 mL/min/1.73   BUN/Creatinine Ratio 14  10 - 22   Sodium 143  134 - 144 mmol/L   Potassium 5.1  3.5 - 5.2 mmol/L   Chloride 99  97 - 108 mmol/L   CO2 27  18 - 29 mmol/L   Calcium 9.3  8.6 - 10.2 mg/dL   Total Protein 6.9  6.0 - 8.5 g/dL   Albumin 4.7  3.5 - 4.8 g/dL   Globulin, Total 2.2  1.5 - 4.5 g/dL   Albumin/Globulin Ratio 2.1  1.1 - 2.5   Total Bilirubin 0.4  0.0 - 1.2 mg/dL   Alkaline Phosphatase 111  39 - 117 IU/L   AST 18  0 - 40 IU/L   ALT 20  0 - 44 IU/L  NMR, LIPOPROFILE      Result Value Range   LDL Particle Number [id number] (*) <1000 nmol/L   LDLC SERPL CALC-MCNC 93  <100 mg/dL   HDL Cholesterol by NMR 23 (*) >=40 mg/dL   Triglycerides by NMR 300 (*) <150 mg/dL   Cholesterol 176  <200 mg/dL   HDL Particle Number 21.7 (*) >=30.5 umol/L   Small LDL Particle Number [id number] (*) <=527 nmol/L   LDL Size 19.6 (*) >20.5 nm   LP-IR Score 76 (*) <=45  TSH      Result Value Range   TSH 2.130  0.450 - 4.500 uIU/mL  FOLATE      Result Value Range   Folate 11.1  >3.0 ng/mL  VITAMIN B12      Result Value Range   Vitamin B-12 260  211 - 946 pg/mL  ANA      Result Value Range   ANA Negative  Negative  RPR      Result Value Range   RPR Non Reactive  Non Reactive  POCT CBC      Result Value Range   WBC 6.3  4.6 - 10.2 K/uL   Lymph, poc 1.9  0.6 - 3.4   POC LYMPH PERCENT 30.5  10 - 50 %L   POC Granulocyte 3.9  2 - 6.9   Granulocyte percent 62.3  37 - 80 %G   RBC 5.9  4.69 - 6.13 M/uL   Hemoglobin 16.9  14.1 - 18.1 g/dL    HCT, POC 51.8  43.5 - 53.7 %   MCV 88.5  80 - 97 fL   MCH, POC 28.9  27 - 31.2 pg   MCHC 32.7  31.8 - 35.4 g/dL   RDW, POC 14.2     Platelet Count, POC 206.0  142 - 424  K/uL   MPV 7.5  0 - 99.8 fL  POCT GLYCOSYLATED HEMOGLOBIN (HGB A1C)      Result Value Range   Hemoglobin A1C 5.5%      ASSESSMENT: HYPERTENSION, MILD - Plan: lisinopril (PRINIVIL,ZESTRIL) 30 MG tablet, CMP14+EGFR, POCT UA - Microscopic Only, POCT urinalysis dipstick  Dyslipidemia - Plan: Lipid panel  Obesity, unspecified  Paresthesias - resolved  Need for pneumococcal vaccination - Plan: Pneumococcal conjugate vaccine 13-valent IM  Hearing loss BP not at goal Advised to keep audiology appointment.suspect he needs hearing aids. No Foreign body in the ear canals.   PLAN:      Dr Paula Libra Recommendations  For nutrition information, I recommend books:  1).Eat to Live by Dr Excell Seltzer. 2).Prevent and Reverse Heart Disease by Dr Karl Luke. 3) Dr Janene Harvey Book:  Program to Reverse Diabetes  Exercise recommendations are:  If unable to walk, then the patient can exercise in a chair 3 times a day. By flapping arms like a bird gently and raising legs outwards to the front.  If ambulatory, the patient can go for walks for 30 minutes 3 times a week. Then increase the intensity and duration as tolerated.  Goal is to try to attain exercise frequency to 5 times a week.  If applicable: Best to perform resistance exercises (machines or weights) 2 days a week and cardio type exercises 3 days per week.  DASH diet and pneumovax in the AVS.  Continue diet weight loss and exercise.  Discussed risks associated with testosterone replacement therapy. Patient did not want any treatment because of risks and he did not notice any difference with treatment.  Orders Placed This Encounter  Procedures  . Pneumococcal conjugate vaccine 13-valent IM  . CMP14+EGFR  . Lipid panel  . POCT UA - Microscopic  Only  . POCT urinalysis dipstick   Meds ordered this encounter  Medications  . lisinopril (PRINIVIL,ZESTRIL) 30 MG tablet    Sig: Take 1 tablet (30 mg total) by mouth daily.    Dispense:  30 tablet    Refill:  4   Medications Discontinued During This Encounter  Medication Reason  . lisinopril (PRINIVIL,ZESTRIL) 20 MG tablet Reorder   Return in about 6 weeks (around 08/25/2013) for recheck BP.  Threasa Kinch P. Jacelyn Grip, M.D.

## 2013-07-14 NOTE — Patient Instructions (Addendum)
Pneumococcal Conjugate Vaccine What You Need to Know Your doctor recommends that you, or your child, get a dose of PCV13 vaccine today. WHY GET VACCINATED? Pneumococcal conjugate vaccine (called PCV13 or Prevnar 13) is recommended to protect infants and toddlers, and some older children and adults with certain health conditions, from pneumococcal disease. Pneumococcal disease is caused by infection with Streptococcus pneumoniae bacteria. These bacteria can spread from person to person through close contact. Pneumococcal disease can lead to severe health problems, including pneumonia, blood infections, and meningitis. Meningitis is an infection of the covering of the brain. Pneumococcal meningitis is fairly rare (less than 1 case per 100,000 people each year), but it leads to other health problems, including deafness and brain damage. In children, it is fatal in about 1 case out of 10. Children younger than two are at higher risk for serious disease than older children. People with certain medical conditions, people over age 15, and cigarette smokers are also at higher risk. Before vaccine, pneumococcal infections caused many problems each year in the [place] in children younger than 5, including:  more than 700 cases of meningitis,  13,000 blood infections,  about 5 million ear infections, and  about 200 deaths. About 4,000 adults still die each year because of pneumococcal infections. Pneumococcal infections can be hard to treat because some strains are resistant to antibiotics. This makes prevention through vaccination even more important. PCV13 VACCINE There are more than 90 types of pneumococcal bacteria. PCV13 protects against 13 of them. These 13 strains cause most severe infections in children and about half of infections in adults.  PCV13 is routinely given to children at 2, 4, 6, and 72 26 months of age. Children in this age range are at greatest risk for serious diseases caused  by pneumococcal infection. PCV13 vaccine may also be recommended for some older children or adults. Your doctor can give you details. A second type of pneumococcal vaccine, called PPSV23, may also be given to some children and adults, including anyone over age 85. There is a separate Vaccine Information Statement for this vaccine. PRECAUTIONS  Anyone who has ever had a life-threatening allergic reaction to a dose of this vaccine, to an earlier pneumococcal vaccine called PCV7 (or Prevnar), or to any vaccine containing diphtheria toxoid (for example, DTaP), should not get PCV13. Anyone with a severe allergy to any component of PCV13 should not get the vaccine. Tell your doctor if the person being vaccinated has any severe allergies. If the person scheduled for vaccination is sick, your doctor might decide to reschedule the shot on another day. Your doctor can give you more information about any of these precautions. RISKS  With any medicine, including vaccines, there is a chance of side effects. These are usually mild and go away on their own, but serious reactions are also possible. Reported problems associated with PCV13 vary by dose and age, but generally:  About half of children became drowsy after the shot, had a temporary loss of appetite, or had redness or tenderness where the shot was given.  About 1 out of 3 had swelling where the shot was given.  About 1 out of 3 had a mild fever, and about 1 in 20 had a higher fever (over 102.2 F or 39 C).  Up to about 8 out of 10 became fussy or irritable. Adults receiving the vaccine have reported redness, pain, and swelling where the shot was given. Mild fever, fatigue, headache, chills, or muscle pain have also  been reported. Life-threatening allergic reactions from any vaccine are very rare. WHAT IF THERE IS A SERIOUS REACTION? What should I look for? Look for anything that concerns you, such as signs of a severe allergic reaction, very high  fever, or behavior changes. Signs of a severe allergic reaction can include hives, swelling of the face and throat, difficulty breathing, a fast heartbeat, dizziness, and weakness. These would start a few minutes to a few hours after the vaccination. What should I do?  If you think it is a severe allergic reaction or other emergency that can't wait, get the person to the nearest hospital or call 9-1-1. Otherwise, call your doctor.  Afterward, the reaction should be reported to the "Vaccine Adverse Event Reporting System" (VAERS). Your doctor might file this report, or you can do it yourself through the VAERS web site at www.vaers.SamedayNews.es, or by calling (364)252-5693. VAERS is only for reporting reactions. They do not give medical advice. THE NATIONAL VACCINE INJURY COMPENSATION PROGRAM The National Vaccine Injury Compensation Program (VICP) was created in 1986. Persons who believe they may have been injured by a vaccine can learn about the program and about filing a claim by calling 806-214-9062 or visiting the [place] website at GoldCloset.com.ee. HOW CAN I LEARN MORE?  Ask your doctor.  Call your local or state health department.  Contact the Centers for Disease Control and Prevention (CDC):  Call 854-549-9811 (1-800-CDC-INFO) or  Visit CDC's website at http://hunter.com/ CDC PCV13 Vaccine VIS (Interim) (08/09/11) Document Released: 03/26/2006 Document Revised: 09/23/2012 Document Reviewed: 09/18/2012 [facility] Patient Information 2014 [place].        Dr Paula Libra Recommendations  For nutrition information, I recommend books:  1).Eat to Live by Dr Excell Seltzer. 2).Prevent and Reverse Heart Disease by Dr Karl Luke. 3) Dr Janene Harvey Book:  Program to Reverse Diabetes  Exercise recommendations are:  If unable to walk, then the patient can exercise in a chair 3 times a day. By flapping arms like a bird gently and raising legs outwards to  the front.  If ambulatory, the patient can go for walks for 30 minutes 3 times a week. Then increase the intensity and duration as tolerated.  Goal is to try to attain exercise frequency to 5 times a week.  If applicable: Best to perform resistance exercises (machines or weights) 2 days a week and cardio type exercises 3 days per week.   DASH Diet The DASH diet stands for "Dietary Approaches to Stop Hypertension." It is a healthy eating plan that has been shown to reduce high blood pressure (hypertension) in as little as 14 days, while also possibly providing other significant health benefits. These other health benefits include reducing the risk of breast cancer after menopause and reducing the risk of type 2 diabetes, heart disease, colon cancer, and stroke. Health benefits also include weight loss and slowing kidney failure in patients with chronic kidney disease.  DIET GUIDELINES  Limit salt (sodium). Your diet should contain less than 1500 mg of sodium daily.  Limit refined or processed carbohydrates. Your diet should include mostly whole grains. Desserts and added sugars should be used sparingly.  Include small amounts of heart-healthy fats. These types of fats include nuts, oils, and tub margarine. Limit saturated and trans fats. These fats have been shown to be harmful in the body. CHOOSING FOODS  The following food groups are based on a 2000 calorie diet. See your Registered Dietitian for individual calorie needs. Grains and Grain Products (6 to  8 servings daily)  Eat More Often: Whole-wheat bread, brown rice, whole-grain or wheat pasta, quinoa, popcorn without added fat or salt (air popped).  Eat Less Often: White bread, white pasta, white rice, cornbread. Vegetables (4 to 5 servings daily)  Eat More Often: Fresh, frozen, and canned vegetables. Vegetables may be raw, steamed, roasted, or grilled with a minimal amount of fat.  Eat Less Often/Avoid: Creamed or fried vegetables.  Vegetables in a cheese sauce. Fruit (4 to 5 servings daily)  Eat More Often: All fresh, canned (in natural juice), or frozen fruits. Dried fruits without added sugar. One hundred percent fruit juice ( cup [237 mL] daily).  Eat Less Often: Dried fruits with added sugar. Canned fruit in light or heavy syrup. [employer], Fish, and Poultry (2 servings or less daily. One serving is 3 to 4 oz [85-114 g]).  Eat More Often: Ninety percent or leaner ground beef, tenderloin, sirloin. Round cuts of beef, chicken breast, [place] breast. All fish. Grill, bake, or broil your meat. Nothing should be fried.  Eat Less Often/Avoid: Fatty cuts of meat, [place], or chicken leg, thigh, or wing. Fried cuts of meat or fish. Dairy (2 to 3 servings)  Eat More Often: Low-fat or fat-free milk, low-fat plain or light yogurt, reduced-fat or part-skim cheese.  Eat Less Often/Avoid: Milk (whole, 2%).Whole milk yogurt. Full-fat cheeses. Nuts, Seeds, and Legumes (4 to 5 servings per week)  Eat More Often: All without added salt.  Eat Less Often/Avoid: Salted nuts and seeds, canned beans with added salt. Fats and Sweets (limited)  Eat More Often: Vegetable oils, tub margarines without trans fats, sugar-free gelatin. Mayonnaise and salad dressings.  Eat Less Often/Avoid: Coconut oils, palm oils, butter, stick margarine, cream, half and half, cookies, candy, pie. FOR MORE INFORMATION The Dash Diet Eating Plan: www.dashdiet.org Document Released: 05/18/2011 Document Revised: 08/21/2011 Document Reviewed: 05/18/2011 [facility] Patient Information 2014 [place], [place].

## 2013-07-15 LAB — CMP14+EGFR
ALT: 17 IU/L (ref 0–44)
AST: 20 IU/L (ref 0–40)
Albumin/Globulin Ratio: 2 (ref 1.1–2.5)
Albumin: 4.6 g/dL (ref 3.5–4.8)
Alkaline Phosphatase: 121 IU/L — ABNORMAL HIGH (ref 39–117)
BUN/Creatinine Ratio: 15 (ref 10–22)
BUN: 14 mg/dL (ref 8–27)
CO2: 24 mmol/L (ref 18–29)
Calcium: 9 mg/dL (ref 8.6–10.2)
Chloride: 101 mmol/L (ref 97–108)
Creatinine, Ser: 0.91 mg/dL (ref 0.76–1.27)
GFR calc Af Amer: 95 mL/min/{1.73_m2} (ref >59)
GFR calc non Af Amer: 82 mL/min/{1.73_m2} (ref >59)
Globulin, Total: 2.3 g/dL (ref 1.5–4.5)
Glucose: 99 mg/dL (ref 65–99)
Potassium: 4.3 mmol/L (ref 3.5–5.2)
Sodium: 142 mmol/L (ref 134–144)
Total Bilirubin: 0.4 mg/dL (ref 0.0–1.2)
Total Protein: 6.9 g/dL (ref 6.0–8.5)

## 2013-07-15 LAB — LIPID PANEL
Chol/HDL Ratio: 6.3 ratio units — ABNORMAL HIGH (ref 0.0–5.0)
Cholesterol, Total: 169 mg/dL (ref 100–199)
HDL: 27 mg/dL — ABNORMAL LOW (ref >39)
LDL Calculated: 108 mg/dL — ABNORMAL HIGH (ref 0–99)
Triglycerides: 169 mg/dL — ABNORMAL HIGH (ref 0–149)
VLDL Cholesterol Cal: 34 mg/dL (ref 5–40)

## 2013-07-16 ENCOUNTER — Other Ambulatory Visit: Payer: Self-pay | Admitting: Family Medicine

## 2013-07-16 DIAGNOSIS — R8290 Unspecified abnormal findings in urine: Secondary | ICD-10-CM

## 2013-07-16 NOTE — Progress Notes (Signed)
Quick Note:  Call Patient Labs that are abnormal: Urine has bacteria and blood in it. Also lipids are abnormal.   The rest are at goal  Recommendations: Needs to repeat the urine with a clean catch mid stream urine.and urine culture. Also needs an appointment with Tammy in regards to the abnormal lipids.    ______

## 2013-07-17 ENCOUNTER — Other Ambulatory Visit: Payer: Self-pay | Admitting: Family Medicine

## 2013-07-17 ENCOUNTER — Telehealth: Payer: Self-pay | Admitting: *Deleted

## 2013-07-17 MED ORDER — PAROXETINE HCL 20 MG PO TABS: 20.0000 mg | ORAL_TABLET | ORAL | Status: DC

## 2013-07-17 NOTE — Progress Notes (Signed)
Aware. 

## 2013-07-17 NOTE — Telephone Encounter (Signed)
He needs his paxil 20mg  refilled also,thanks.

## 2013-07-17 NOTE — Telephone Encounter (Signed)
Call patient : Prescription refilled & sent to pharmacy in [place]. 

## 2013-07-21 ENCOUNTER — Other Ambulatory Visit (INDEPENDENT_AMBULATORY_CARE_PROVIDER_SITE_OTHER): Payer: Medicare HMO

## 2013-07-21 DIAGNOSIS — R8290 Unspecified abnormal findings in urine: Secondary | ICD-10-CM

## 2013-07-21 DIAGNOSIS — R82998 Other abnormal findings in urine: Secondary | ICD-10-CM

## 2013-07-21 LAB — POCT URINALYSIS DIPSTICK
Bilirubin, UA: NEGATIVE
Glucose, UA: NEGATIVE
Ketones, UA: NEGATIVE
Leukocytes, UA: NEGATIVE
Nitrite, UA: NEGATIVE
Protein, UA: NEGATIVE
Spec Grav, UA: 1.01
Urobilinogen, UA: NEGATIVE
pH, UA: 6

## 2013-07-21 LAB — POCT UA - MICROSCOPIC ONLY
Bacteria, U Microscopic: NEGATIVE
Casts, Ur, LPF, POC: NEGATIVE
Crystals, Ur, HPF, POC: NEGATIVE
Mucus, UA: NEGATIVE
WBC, Ur, HPF, POC: NEGATIVE
Yeast, UA: NEGATIVE

## 2013-07-21 NOTE — Progress Notes (Signed)
Pt came in for labs only 

## 2013-07-23 LAB — URINE CULTURE: Organism ID, Bacteria: NO GROWTH

## 2013-07-25 ENCOUNTER — Ambulatory Visit: Payer: Medicare HMO | Admitting: Family Medicine

## 2013-12-31 ENCOUNTER — Ambulatory Visit (INDEPENDENT_AMBULATORY_CARE_PROVIDER_SITE_OTHER): Payer: Medicare HMO | Admitting: Family Medicine

## 2013-12-31 ENCOUNTER — Encounter: Payer: Self-pay | Admitting: Family Medicine

## 2013-12-31 VITALS — BP 130/84 | HR 72 | Temp 97.0°F | Ht 72.0 in | Wt 252.4 lb

## 2013-12-31 DIAGNOSIS — F32A Depression, unspecified: Secondary | ICD-10-CM

## 2013-12-31 DIAGNOSIS — I10 Essential (primary) hypertension: Secondary | ICD-10-CM

## 2013-12-31 DIAGNOSIS — J0100 Acute maxillary sinusitis, unspecified: Secondary | ICD-10-CM

## 2013-12-31 DIAGNOSIS — F329 Major depressive disorder, single episode, unspecified: Secondary | ICD-10-CM

## 2013-12-31 DIAGNOSIS — F3289 Other specified depressive episodes: Secondary | ICD-10-CM

## 2013-12-31 DIAGNOSIS — H109 Unspecified conjunctivitis: Secondary | ICD-10-CM

## 2013-12-31 DIAGNOSIS — J0101 Acute recurrent maxillary sinusitis: Secondary | ICD-10-CM

## 2013-12-31 LAB — POCT CBC
Granulocyte percent: 62.3 %G (ref 37–80)
HCT, POC: 48.3 % (ref 43.5–53.7)
Hemoglobin: 15.7 g/dL (ref 14.1–18.1)
Lymph, poc: 1.5 (ref 0.6–3.4)
MCH, POC: 29.3 pg (ref 27–31.2)
MCHC: 32.5 g/dL (ref 31.8–35.4)
MCV: 89.9 fL (ref 80–97)
MPV: 8 fL (ref 0–99.8)
POC Granulocyte: 3.3 (ref 2–6.9)
POC LYMPH PERCENT: 28 %L (ref 10–50)
Platelet Count, POC: 186 10*3/uL (ref 142–424)
RBC: 5.4 M/uL (ref 4.69–6.13)
RDW, POC: 12.9 %
WBC: 5.3 10*3/uL (ref 4.6–10.2)

## 2013-12-31 MED ORDER — AMOXICILLIN 875 MG PO TABS: 875.0000 mg | ORAL_TABLET | Freq: Two times a day (BID) | ORAL | Status: DC

## 2013-12-31 MED ORDER — POLYMYXIN B-TRIMETHOPRIM 10000-0.1 UNIT/ML-% OP SOLN: 2.0000 [drp] | OPHTHALMIC | Status: DC

## 2013-12-31 MED ORDER — PAROXETINE HCL 20 MG PO TABS: 20.0000 mg | ORAL_TABLET | ORAL | Status: DC

## 2013-12-31 MED ORDER — LISINOPRIL 30 MG PO TABS: 30.0000 mg | ORAL_TABLET | Freq: Every day | ORAL | Status: DC

## 2013-12-31 NOTE — Progress Notes (Signed)
   Subjective:    Patient ID: Nicholas Hawkins, male    DOB: 03/01/1936, 78 y.o.   MRN: [id number]  HPI This 78 y.o. male presents for evaluation of hypertension, fatigue, and sinus pressure and red eyes. He has been outside working in the heat and c/o fatigue.  He needs refills .   Review of Systems No chest pain, SOB, HA, dizziness, vision change, N/V, diarrhea, constipation, dysuria, urinary urgency or frequency, myalgias, arthralgias or rash.     Objective:   Physical Exam Vital signs noted  Well developed well nourished male.  HEENT - Head atraumatic Normocephalic                Eyes - PERRLA, Conjuctiva - clear Sclera- Clear EOMI                Ears - EAC's Wnl TM's Wnl Gross Hearing WNL                Nose - Nares patent                 Throat - oropharanx wnl Respiratory - Lungs CTA bilateral Cardiac - RRR S1 and S2 without murmur GI - Abdomen soft Nontender and bowel sounds active x 4 Extremities - No edema. Neuro - Grossly intact.       Assessment & Plan:  Acute recurrent maxillary sinusitis - Plan: amoxicillin (AMOXIL) 875 MG tablet, POCT CBC, CMP14+EGFR, Lipid panel, TSH  Essential hypertension, benign - Plan: POCT CBC, CMP14+EGFR, Lipid panel, TSH  HYPERTENSION, MILD - Plan: lisinopril (PRINIVIL,ZESTRIL) 30 MG tablet  Depression - Plan: PARoxetine (PAXIL) 20 MG tablet  Bilateral conjunctivitis - Plan: trimethoprim-polymyxin b (POLYTRIM) ophthalmic solution  William J Oxford FNP 

## 2014-01-01 ENCOUNTER — Other Ambulatory Visit: Payer: Self-pay | Admitting: Family Medicine

## 2014-01-01 ENCOUNTER — Telehealth: Payer: Self-pay | Admitting: *Deleted

## 2014-01-01 LAB — CMP14+EGFR
ALT: 29 IU/L (ref 0–44)
AST: 24 IU/L (ref 0–40)
Albumin/Globulin Ratio: 2 (ref 1.1–2.5)
Albumin: 4.7 g/dL (ref 3.5–4.8)
Alkaline Phosphatase: 116 IU/L (ref 39–117)
BUN/Creatinine Ratio: 19 (ref 10–22)
BUN: 17 mg/dL (ref 8–27)
CO2: 24 mmol/L (ref 18–29)
Calcium: 9.2 mg/dL (ref 8.6–10.2)
Chloride: 99 mmol/L (ref 97–108)
Creatinine, Ser: 0.91 mg/dL (ref 0.76–1.27)
GFR calc Af Amer: 94 mL/min/{1.73_m2} (ref >59)
GFR calc non Af Amer: 82 mL/min/{1.73_m2} (ref >59)
Globulin, Total: 2.3 g/dL (ref 1.5–4.5)
Glucose: 86 mg/dL (ref 65–99)
Potassium: 4.2 mmol/L (ref 3.5–5.2)
Sodium: 140 mmol/L (ref 134–144)
Total Bilirubin: 0.5 mg/dL (ref 0.0–1.2)
Total Protein: 7 g/dL (ref 6.0–8.5)

## 2014-01-01 LAB — LIPID PANEL
Chol/HDL Ratio: 8.2 ratio units — ABNORMAL HIGH (ref 0.0–5.0)
Cholesterol, Total: 196 mg/dL (ref 100–199)
HDL: 24 mg/dL — ABNORMAL LOW (ref >39)
LDL Calculated: 107 mg/dL — ABNORMAL HIGH (ref 0–99)
Triglycerides: 327 mg/dL — ABNORMAL HIGH (ref 0–149)
VLDL Cholesterol Cal: 65 mg/dL — ABNORMAL HIGH (ref 5–40)

## 2014-01-01 LAB — TSH: TSH: 1.58 u[IU]/mL (ref 0.450–4.500)

## 2014-01-01 NOTE — Telephone Encounter (Signed)
Pt notified of lab results Verbalizes understanding 

## 2014-01-01 NOTE — Telephone Encounter (Signed)
Message copied by Marin Olp on Thu Jan 01, 2014  4:43 PM ------      Message from: Lysbeth Penner      Created: Thu Jan 01, 2014  4:32 PM       Trigs elevated and would take fish oil or omega 3 otc ------

## 2014-04-07 ENCOUNTER — Encounter: Payer: Self-pay | Admitting: Family Medicine

## 2014-04-07 ENCOUNTER — Ambulatory Visit (INDEPENDENT_AMBULATORY_CARE_PROVIDER_SITE_OTHER): Payer: Medicare HMO | Admitting: Family Medicine

## 2014-04-07 VITALS — BP 120/79 | HR 73 | Temp 98.1°F | Ht 72.0 in | Wt 257.0 lb

## 2014-04-07 DIAGNOSIS — J301 Allergic rhinitis due to pollen: Secondary | ICD-10-CM

## 2014-04-07 DIAGNOSIS — Z23 Encounter for immunization: Secondary | ICD-10-CM

## 2014-04-07 MED ORDER — AZELASTINE HCL 0.1 % NA SOLN: 2.0000 | Freq: Two times a day (BID) | NASAL | Status: DC

## 2014-04-07 MED ORDER — FLUTICASONE PROPIONATE 50 MCG/ACT NA SUSP: 2.0000 | Freq: Every day | NASAL | Status: DC

## 2014-04-07 NOTE — Patient Instructions (Signed)
Drink plenty of fluids Use prescription nose sprays as directed 1-2 sprays each nostril at bedtime. Start with 2 sprays each nostril at bedtime and after week or 10 days cut it back to 1 spray each nostril at bedtime Take Mucinex maximum strength cough feels blue and white in color 1 twice daily as needed for cough and congestion with a large glass of water--- this is over-the-counter. Use saline nose spray during the day

## 2014-04-07 NOTE — Progress Notes (Addendum)
Subjective:    Patient ID: Nicholas Hawkins, male    DOB: December 21, 1935, 78 y.o.   MRN: [id number]  HPI Patient here today for sinus pressure, drainage, and irritated eyes. This started about 2 weeks ago. This seems to be a seasonal problem in the fall and the spring. The patient has had no yellow drainage and no fever. The cough is only rarely when the drainage gets into his chest.         Patient Active Problem List   Diagnosis Date Noted  . Hearing loss 07/14/2013  . Need for pneumococcal vaccination 07/14/2013  . Hyperlipidemia   . Low testosterone   . Paresthesias 03/06/2013  . Dyslipidemia 03/06/2013  . Obesity, unspecified 03/01/2010  . HYPERTENSION, MILD 03/01/2010  . NECK PAIN 03/01/2010  . DIZZINESS AND GIDDINESS 02/28/2010  . FATIGUE 02/28/2010  . ALTERED MENTAL STATUS 02/28/2010  . HEADACHE 02/28/2010   Outpatient Encounter Prescriptions as of 04/07/2014  Medication Sig  . lisinopril (PRINIVIL,ZESTRIL) 30 MG tablet Take 1 tablet (30 mg total) by mouth daily.  Marland Kitchen PARoxetine (PAXIL) 20 MG tablet Take 1 tablet (20 mg total) by mouth every morning.  . [DISCONTINUED] amoxicillin (AMOXIL) 875 MG tablet Take 1 tablet (875 mg total) by mouth 2 (two) times daily.  . [DISCONTINUED] trimethoprim-polymyxin b (POLYTRIM) ophthalmic solution Place 2 drops into both eyes every 4 (four) hours.    Review of Systems  Constitutional: Negative.  Negative for fever.  HENT: Positive for congestion, postnasal drip and sinus pressure.   Eyes: Positive for redness (irritation).  Respiratory: Positive for cough (slight).   Cardiovascular: Negative.   Gastrointestinal: Negative.   Endocrine: Negative.   Genitourinary: Negative.   Musculoskeletal: Negative.   Skin: Negative.   Allergic/Immunologic: Negative.   Neurological: Negative.   Hematological: Negative.   Psychiatric/Behavioral: Negative.        Objective:   Physical Exam  Nursing note and vitals reviewed. Constitutional:  He is oriented to person, place, and time. He appears well-developed and well-nourished. No distress.  HENT:  Head: Normocephalic and atraumatic.  Right Ear: External ear normal.  Left Ear: External ear normal.  Mouth/Throat: Oropharynx is clear and moist. No oropharyngeal exudate.  Nasal congestion bilaterally  Eyes: Conjunctivae and EOM are normal. Pupils are equal, round, and reactive to light. Right eye exhibits no discharge. Left eye exhibits no discharge. No scleral icterus.  Neck: Normal range of motion. Neck supple. No thyromegaly present.  No anterior cervical adenopathy  Cardiovascular: Normal rate, regular rhythm and normal heart sounds.   No murmur heard. Pulmonary/Chest: Effort normal and breath sounds normal. No respiratory distress. He has no wheezes. He has no rales. He exhibits no tenderness.  The chest is clear anteriorly and posteriorly. There is no congestion with coughing.  Musculoskeletal: Normal range of motion. He exhibits no edema.  Lymphadenopathy:    He has no cervical adenopathy.  Neurological: He is alert and oriented to person, place, and time.  Skin: Skin is warm and dry. No rash noted.  Psychiatric: He has a normal mood and affect. His behavior is normal. Judgment and thought content normal.   BP 120/79  Pulse 73  Temp(Src) 98.1 F (36.7 C) (Oral)  Ht 6' (1.829 m)  Wt 257 lb (116.574 kg)  BMI 34.85 kg/m2        Assessment & Plan:   1. Allergic rhinitis due to pollen  Meds ordered this encounter  Medications  . fluticasone (FLONASE) 50 MCG/ACT nasal spray  Sig: Place 2 sprays into both nostrils at bedtime.    Dispense:  16 g    Refill:  6  . azelastine (ASTELIN) 0.1 % nasal spray    Sig: Place 2 sprays into both nostrils 2 (two) times daily. Use in each nostril as directed    Dispense:  30 mL    Refill:  6     Patient Instructions  Drink plenty of fluids Use prescription nose sprays as directed 1-2 sprays each nostril at bedtime.  Start with 2 sprays each nostril at bedtime and after week or 10 days cut it back to 1 spray each nostril at bedtime Take Mucinex maximum strength cough feels blue and white in color 1 twice daily as needed for cough and congestion with a large glass of water--- this is over-the-counter. Use saline nose spray during the day   Arrie Senate MD

## 2014-07-13 ENCOUNTER — Telehealth: Payer: Self-pay | Admitting: Nurse Practitioner

## 2014-07-21 DIAGNOSIS — L570 Actinic keratosis: Secondary | ICD-10-CM | POA: Diagnosis not present

## 2014-07-21 DIAGNOSIS — D485 Neoplasm of uncertain behavior of skin: Secondary | ICD-10-CM | POA: Diagnosis not present

## 2014-07-21 DIAGNOSIS — Z85828 Personal history of other malignant neoplasm of skin: Secondary | ICD-10-CM | POA: Diagnosis not present

## 2014-08-13 DIAGNOSIS — C44621 Squamous cell carcinoma of skin of unspecified upper limb, including shoulder: Secondary | ICD-10-CM | POA: Diagnosis not present

## 2014-08-13 DIAGNOSIS — L570 Actinic keratosis: Secondary | ICD-10-CM | POA: Diagnosis not present

## 2014-09-18 ENCOUNTER — Ambulatory Visit (INDEPENDENT_AMBULATORY_CARE_PROVIDER_SITE_OTHER): Payer: Commercial Managed Care - HMO | Admitting: Family Medicine

## 2014-09-18 ENCOUNTER — Encounter: Payer: Self-pay | Admitting: Family Medicine

## 2014-09-18 VITALS — BP 128/80 | HR 62 | Temp 98.2°F | Ht 72.0 in | Wt 256.4 lb

## 2014-09-18 DIAGNOSIS — Z125 Encounter for screening for malignant neoplasm of prostate: Secondary | ICD-10-CM

## 2014-09-18 DIAGNOSIS — J302 Other seasonal allergic rhinitis: Secondary | ICD-10-CM | POA: Diagnosis not present

## 2014-09-18 DIAGNOSIS — I10 Essential (primary) hypertension: Secondary | ICD-10-CM | POA: Diagnosis not present

## 2014-09-18 DIAGNOSIS — E785 Hyperlipidemia, unspecified: Secondary | ICD-10-CM | POA: Diagnosis not present

## 2014-09-18 DIAGNOSIS — H538 Other visual disturbances: Secondary | ICD-10-CM

## 2014-09-18 DIAGNOSIS — R5383 Other fatigue: Secondary | ICD-10-CM

## 2014-09-18 LAB — POCT CBC
Granulocyte percent: 58.9 %G (ref 37–80)
HEMATOCRIT: 49.1 % (ref 43.5–53.7)
HEMOGLOBIN: 15.4 g/dL (ref 14.1–18.1)
Lymph, poc: 1.7 (ref 0.6–3.4)
MCH, POC: 28 pg (ref 27–31.2)
MCHC: 31.5 g/dL — AB (ref 31.8–35.4)
MCV: 89 fL (ref 80–97)
MPV: 8.9 fL (ref 0–99.8)
POC Granulocyte: 2.8 (ref 2–6.9)
POC LYMPH %: 36.4 % (ref 10–50)
Platelet Count, POC: 201 10*3/uL (ref 142–424)
RBC: 5.51 M/uL (ref 4.69–6.13)
RDW, POC: 13.3 %
WBC: 4.8 10*3/uL (ref 4.6–10.2)

## 2014-09-18 MED ORDER — BETAMETHASONE SOD PHOS & ACET 6 (3-3) MG/ML IJ SUSP
6.0000 mg | Freq: Once | INTRAMUSCULAR | Status: AC
  Administered 2014-09-18: 6 mg via INTRAMUSCULAR

## 2014-09-18 NOTE — Patient Instructions (Signed)
Take Allegra 180 mg every day for your allergies. It is also available as a store brand called fexofenadine at most drugstores.

## 2014-09-18 NOTE — Progress Notes (Signed)
Subjective:  Patient ID: Nicholas Hawkins, male    DOB: [birth year]  Age: 79 y.o. MRN: [id number]  CC: Hypertension and Hyperlipidemia   HPI Nicholas Hawkins presents for blood pressure and cholesterol but also patient says is having a lot of allergy problems he states that the allergies are so bad he hears his own echo in his ears. The 2 medicines the nasal sprays listed below azelastine and fluticasone aren't helpful at all   follow-up of hypertension. Patient has no history of headache chest pain or shortness of breath or recent cough. Patient also denies symptoms of TIA such as numbness weakness lateralizing. Patient checks  blood pressure at home and has not had any elevated readings recently. Patient denies side effects from his medication. States taking it regularly.   Currently not taking medication for cholesterol. Denies any symptoms such as chest pain focal lateralizing deficits that could be a TIA or stroke. This in spite of not taking appropriate respect reducing medications    History Nicholas Hawkins has a past medical history of Obesity, unspecified; HYPERTENSION, MILD; Hyperlipidemia; Low testosterone; and Obesity.   He has past surgical history that includes Appendectomy.   His family history includes Heart disease in his mother.He reports that he has never smoked. He does not have any smokeless tobacco history on file. He reports that he does not drink alcohol or use illicit drugs.  Current Outpatient Prescriptions on File Prior to Visit  Medication Sig Dispense Refill  . lisinopril (PRINIVIL,ZESTRIL) 30 MG tablet Take 1 tablet (30 mg total) by mouth daily. 30 tablet 11  . PARoxetine (PAXIL) 20 MG tablet Take 1 tablet (20 mg total) by mouth every morning. 30 tablet 11   No current facility-administered medications on file prior to visit.    ROS Review of Systems  Constitutional: Negative for fever, chills, diaphoresis and unexpected weight change.  HENT: Negative for  congestion, hearing loss, rhinorrhea, sore throat and trouble swallowing.   Respiratory: Negative for cough, chest tightness, shortness of breath and wheezing.   Gastrointestinal: Negative for nausea, vomiting, abdominal pain, diarrhea, constipation and abdominal distention.  Endocrine: Negative for cold intolerance and heat intolerance.  Genitourinary: Negative for dysuria, hematuria and flank pain.  Musculoskeletal: Negative for joint swelling and arthralgias.  Skin: Negative for rash.  Neurological: Negative for dizziness and headaches.  Psychiatric/Behavioral: Negative for dysphoric mood, decreased concentration and agitation. The patient is not nervous/anxious.     Objective:  BP 128/80 mmHg  Pulse 62  Temp(Src) 98.2 F (36.8 C) (Oral)  Ht 6' (1.829 m)  Wt 256 lb 6.4 oz (116.302 kg)  BMI 34.77 kg/m2  BP Readings from Last 3 Encounters:  09/18/14 128/80  04/07/14 120/79  12/31/13 130/84    Wt Readings from Last 3 Encounters:  09/18/14 256 lb 6.4 oz (116.302 kg)  04/07/14 257 lb (116.574 kg)  12/31/13 252 lb 6.4 oz (114.488 kg)     Physical Exam  Constitutional: He is oriented to person, place, and time. He appears well-developed and well-nourished. No distress.  HENT:  Head: Normocephalic and atraumatic.  Right Ear: External ear normal.  Left Ear: External ear normal.  Nose: Nose normal.  Mouth/Throat: Oropharynx is clear and moist.  Eyes: Conjunctivae and EOM are normal. Pupils are equal, round, and reactive to light.  Neck: Normal range of motion. Neck supple. No thyromegaly present.  Cardiovascular: Normal rate, regular rhythm and normal heart sounds.   No murmur heard. Pulmonary/Chest: Effort normal and breath sounds normal.  No respiratory distress. He has no wheezes. He has no rales.  Abdominal: Soft. Bowel sounds are normal. He exhibits no distension. There is no tenderness.  Lymphadenopathy:    He has no cervical adenopathy.  Neurological: He is alert and  oriented to person, place, and time. He has normal reflexes.  Skin: Skin is warm and dry.  Psychiatric: He has a normal mood and affect. His behavior is normal. Judgment and thought content normal.    Lab Results  Component Value Date   HGBA1C 5.5% 03/06/2013    Lab Results  Component Value Date   WBC 5.3 12/31/2013   HGB 15.7 12/31/2013   HCT 48.3 12/31/2013   GLUCOSE 86 12/31/2013   CHOL 196 12/31/2013   TRIG 327* 12/31/2013   HDL 24* 12/31/2013   LDLCALC 107* 12/31/2013   ALT 29 12/31/2013   AST 24 12/31/2013   NA 140 12/31/2013   K 4.2 12/31/2013   CL 99 12/31/2013   CREATININE 0.91 12/31/2013   BUN 17 12/31/2013   CO2 24 12/31/2013   TSH 1.580 12/31/2013   HGBA1C 5.5% 03/06/2013    No results found.  Assessment & Plan:   Nicholas Hawkins was seen today for hypertension and hyperlipidemia.  Diagnoses and all orders for this visit:  HYPERTENSION, MILD Orders: -     CMP14+EGFR  Dyslipidemia Orders: -     CMP14+EGFR -     NMR, lipoprofile  Other fatigue Orders: -     POCT CBC -     Vit D  25 hydroxy (rtn osteoporosis monitoring)  Screening for prostate cancer Orders: -     PSA, total and free  Vision blurring Orders: -     Ambulatory referral to Ophthalmology  Other seasonal allergic rhinitis Orders: -     betamethasone acetate-betamethasone sodium phosphate (CELESTONE) injection 6 mg; Inject 1 mL (6 mg total) into the muscle once.   I have discontinued Nicholas Hawkins fluticasone and azelastine. I am also having him maintain his PARoxetine and lisinopril. We will continue to administer betamethasone acetate-betamethasone sodium phosphate.  Meds ordered this encounter  Medications  . betamethasone acetate-betamethasone sodium phosphate (CELESTONE) injection 6 mg    Sig:    He was also told to take Allegra  180 mg over-the-counter daily as needed for allergy symptoms  Follow-up: Return in about 6 months (around 03/20/2015).  Claretta Fraise, M.D.

## 2014-09-19 LAB — CMP14+EGFR
ALBUMIN: 4.6 g/dL (ref 3.5–4.8)
ALT: 22 IU/L (ref 0–44)
AST: 20 IU/L (ref 0–40)
Albumin/Globulin Ratio: 1.8 (ref 1.1–2.5)
Alkaline Phosphatase: 129 IU/L — ABNORMAL HIGH (ref 39–117)
BILIRUBIN TOTAL: 0.5 mg/dL (ref 0.0–1.2)
BUN / CREAT RATIO: 19 (ref 10–22)
BUN: 15 mg/dL (ref 8–27)
CHLORIDE: 99 mmol/L (ref 97–108)
CO2: 23 mmol/L (ref 18–29)
CREATININE: 0.78 mg/dL (ref 0.76–1.27)
Calcium: 9 mg/dL (ref 8.6–10.2)
GFR calc Af Amer: 101 mL/min/{1.73_m2} (ref >59)
GFR calc non Af Amer: 87 mL/min/{1.73_m2} (ref >59)
Globulin, Total: 2.5 g/dL (ref 1.5–4.5)
Glucose: 113 mg/dL — ABNORMAL HIGH (ref 65–99)
Potassium: 4.2 mmol/L (ref 3.5–5.2)
Sodium: 138 mmol/L (ref 134–144)
Total Protein: 7.1 g/dL (ref 6.0–8.5)

## 2014-09-19 LAB — NMR, LIPOPROFILE
Cholesterol: 166 mg/dL (ref 100–199)
HDL Cholesterol by NMR: 21 mg/dL — ABNORMAL LOW (ref >39)
HDL Particle Number: 23.2 umol/L — ABNORMAL LOW (ref >=30.5)
LDL Particle Number: 1890 nmol/L — ABNORMAL HIGH (ref <1000)
LDL Size: 19.6 nm (ref >20.5)
LDL-C: 100 mg/dL — AB (ref 0–99)
LP-IR Score: 67 — ABNORMAL HIGH (ref <=45)
Small LDL Particle Number: 1544 nmol/L — ABNORMAL HIGH (ref <=527)
TRIGLYCERIDES BY NMR: 227 mg/dL — AB (ref 0–149)

## 2014-09-19 LAB — VITAMIN D 25 HYDROXY (VIT D DEFICIENCY, FRACTURES): VIT D 25 HYDROXY: 19.5 ng/mL — AB (ref 30.0–100.0)

## 2014-09-19 LAB — PSA, TOTAL AND FREE
PSA, Free Pct: 23.6 %
PSA, Free: 0.33 ng/mL
PSA: 1.4 ng/mL (ref 0.0–4.0)

## 2014-09-20 ENCOUNTER — Other Ambulatory Visit: Payer: Self-pay | Admitting: Family Medicine

## 2014-09-20 MED ORDER — VITAMIN D (ERGOCALCIFEROL) 1.25 MG (50000 UNIT) PO CAPS: 50000.0000 [IU] | ORAL_CAPSULE | ORAL | Status: DC

## 2014-09-20 MED ORDER — ATORVASTATIN CALCIUM 40 MG PO TABS: 40.0000 mg | ORAL_TABLET | Freq: Every day | ORAL | Status: DC

## 2014-09-24 ENCOUNTER — Telehealth: Payer: Self-pay | Admitting: Family Medicine

## 2014-09-24 NOTE — Progress Notes (Signed)
Patient aware of labs and wants rxs sent to the drug store in [place].

## 2014-09-24 NOTE — Telephone Encounter (Signed)
Medications sent in on the 10th.

## 2014-12-28 DIAGNOSIS — L570 Actinic keratosis: Secondary | ICD-10-CM | POA: Diagnosis not present

## 2014-12-29 ENCOUNTER — Ambulatory Visit (INDEPENDENT_AMBULATORY_CARE_PROVIDER_SITE_OTHER): Payer: Commercial Managed Care - HMO | Admitting: Family Medicine

## 2014-12-29 ENCOUNTER — Encounter: Payer: Self-pay | Admitting: Family Medicine

## 2014-12-29 VITALS — BP 118/72 | HR 72 | Temp 98.0°F | Ht 72.0 in | Wt 257.0 lb

## 2014-12-29 DIAGNOSIS — R42 Dizziness and giddiness: Secondary | ICD-10-CM

## 2014-12-29 MED ORDER — MECLIZINE HCL 32 MG PO TABS: 32.0000 mg | ORAL_TABLET | Freq: Three times a day (TID) | ORAL | Status: DC | PRN

## 2014-12-29 NOTE — Progress Notes (Signed)
   Subjective:    Patient ID: Nicholas Hawkins, male    DOB: 1936/05/13, 79 y.o.   MRN: [id number]  HPI Patient here today for dizziness and sinus trouble that started Saturday after mowing the yard.  Recent does not have true vertigo. There is been no nausea or vomiting. He does have a history of inner ear problems. He could be that trouble started when he became a little dehydrated as well.    Patient Active Problem List   Diagnosis Date Noted  . Hearing loss 07/14/2013  . Need for pneumococcal vaccination 07/14/2013  . Hyperlipidemia   . Low testosterone   . Paresthesias 03/06/2013  . Dyslipidemia 03/06/2013  . Obesity, unspecified 03/01/2010  . HYPERTENSION, MILD 03/01/2010  . NECK PAIN 03/01/2010  . DIZZINESS AND GIDDINESS 02/28/2010  . Other fatigue 02/28/2010  . ALTERED MENTAL STATUS 02/28/2010  . HEADACHE 02/28/2010   Outpatient Encounter Prescriptions as of 12/29/2014  Medication Sig  . atorvastatin (LIPITOR) 40 MG tablet Take 1 tablet (40 mg total) by mouth daily. For cholesterol  . lisinopril (PRINIVIL,ZESTRIL) 30 MG tablet Take 1 tablet (30 mg total) by mouth daily.  Marland Kitchen PARoxetine (PAXIL) 20 MG tablet Take 1 tablet (20 mg total) by mouth every morning.  . Vitamin D, Ergocalciferol, (DRISDOL) 50000 UNITS CAPS capsule Take 1 capsule (50,000 Units total) by mouth 2 (two) times a week.   No facility-administered encounter medications on file as of 12/29/2014.      Review of Systems  Constitutional: Negative.   HENT: Positive for postnasal drip and sinus pressure.   Eyes: Negative.   Respiratory: Negative.   Cardiovascular: Negative.   Gastrointestinal: Negative.   Endocrine: Negative.   Genitourinary: Negative.   Musculoskeletal: Negative.   Skin: Negative.   Allergic/Immunologic: Negative.   Neurological: Positive for dizziness and light-headedness.  Hematological: Negative.   Psychiatric/Behavioral: Negative.        Objective:   Physical Exam    Constitutional: He appears well-developed and well-nourished.  HENT:  Head: Normocephalic.  Left external auditory canal with excessive cerumen. This was successfully irrigated  Eyes:  There is no nystagmus  Cardiovascular: Normal rate and regular rhythm.    BP 118/72 mmHg  Pulse 72  Temp(Src) 98 F (36.7 C) (Oral)  Ht 6' (1.829 m)  Wt 257 lb (116.574 kg)  BMI 34.85 kg/m2        Assessment & Plan:  1. Dizziness and giddiness Probably related to inner ear infection . There is no hypertension or dysrhythmia by history.  Will offer prescription for Antivert  Wardell Honour MD

## 2015-01-13 ENCOUNTER — Other Ambulatory Visit: Payer: Self-pay | Admitting: *Deleted

## 2015-01-13 DIAGNOSIS — F329 Major depressive disorder, single episode, unspecified: Secondary | ICD-10-CM

## 2015-01-13 DIAGNOSIS — F32A Depression, unspecified: Secondary | ICD-10-CM

## 2015-01-13 DIAGNOSIS — I10 Essential (primary) hypertension: Secondary | ICD-10-CM

## 2015-01-13 MED ORDER — PAROXETINE HCL 20 MG PO TABS: 20.0000 mg | ORAL_TABLET | ORAL | Status: DC

## 2015-01-13 MED ORDER — LISINOPRIL 30 MG PO TABS: 30.0000 mg | ORAL_TABLET | Freq: Every day | ORAL | Status: DC

## 2015-01-25 ENCOUNTER — Ambulatory Visit (INDEPENDENT_AMBULATORY_CARE_PROVIDER_SITE_OTHER): Payer: Commercial Managed Care - HMO | Admitting: Family Medicine

## 2015-01-25 ENCOUNTER — Encounter: Payer: Self-pay | Admitting: Family Medicine

## 2015-01-25 VITALS — BP 132/85 | HR 69 | Temp 97.4°F | Ht 72.0 in | Wt 258.8 lb

## 2015-01-25 DIAGNOSIS — J0100 Acute maxillary sinusitis, unspecified: Secondary | ICD-10-CM | POA: Diagnosis not present

## 2015-01-25 DIAGNOSIS — H6503 Acute serous otitis media, bilateral: Secondary | ICD-10-CM | POA: Diagnosis not present

## 2015-01-25 MED ORDER — AMOXICILLIN-POT CLAVULANATE 875-125 MG PO TABS: 1.0000 | ORAL_TABLET | Freq: Two times a day (BID) | ORAL | Status: DC

## 2015-01-25 MED ORDER — BETAMETHASONE SOD PHOS & ACET 6 (3-3) MG/ML IJ SUSP
6.0000 mg | Freq: Once | INTRAMUSCULAR | Status: AC
  Administered 2015-01-25: 6 mg via INTRAMUSCULAR

## 2015-01-25 NOTE — Progress Notes (Signed)
Subjective:  Patient ID: Nicholas Hawkins, male    DOB: [birth year]  Age: 79 y.o. MRN: [id number]  CC: Sinusitis and Ear Pain   HPI Nicholas Hawkins presents for several days of increasing facial pressure and pain in the teeth. It is usually poor hearing is even worse. He has pain in both ears more on the left. It seems to been worsened [place]. He is having some post nasal drainage. Patient could not understand questions due to the hearing loss but as best could be determined he does not have any fever or cough.  History Nicholas Hawkins has a past medical history of Obesity, unspecified; HYPERTENSION, MILD; Hyperlipidemia; Low testosterone; and Obesity.   He has past surgical history that includes Appendectomy.   His family history includes Heart disease in his mother.He reports that he has never smoked. He does not have any smokeless tobacco history on file. He reports that he does not drink alcohol or use illicit drugs.  Outpatient Prescriptions Prior to Visit  Medication Sig Dispense Refill  . atorvastatin (LIPITOR) 40 MG tablet Take 1 tablet (40 mg total) by mouth daily. For cholesterol (Patient not taking: Reported on 01/25/2015) 90 tablet 3  . lisinopril (PRINIVIL,ZESTRIL) 30 MG tablet Take 1 tablet (30 mg total) by mouth daily. 30 tablet 2  . meclizine (ANTIVERT) 32 MG tablet Take 1 tablet (32 mg total) by mouth 3 (three) times daily as needed. 15 tablet 1  . PARoxetine (PAXIL) 20 MG tablet Take 1 tablet (20 mg total) by mouth every morning. 30 tablet 1  . Vitamin D, Ergocalciferol, (DRISDOL) 50000 UNITS CAPS capsule Take 1 capsule (50,000 Units total) by mouth 2 (two) times a week. 16 capsule 0   No facility-administered medications prior to visit.    ROS Review of Systems  Unable to perform ROS  Patient hard of hearing, could not understand questions. He did say he has a hearing aid on order Objective:  BP 132/85 mmHg  Pulse 69  Temp(Src) 97.4 F  (36.3 C)  Ht 6' (1.829 m)  Wt 258 lb 12.8 oz (117.391 kg)  BMI 35.09 kg/m2  BP Readings from Last 3 Encounters:  01/25/15 132/85  12/29/14 118/72  09/18/14 128/80    Wt Readings from Last 3 Encounters:  01/25/15 258 lb 12.8 oz (117.391 kg)  12/29/14 257 lb (116.574 kg)  09/18/14 256 lb 6.4 oz (116.302 kg)     Physical Exam  Constitutional: He appears well-developed and well-nourished.  HENT:  Head: Normocephalic and atraumatic.  Right Ear: External ear normal. A middle ear effusion is present. No decreased hearing is noted.  Left Ear: External ear normal. A middle ear effusion is present. No decreased hearing is noted.  Nose: Mucosal edema and rhinorrhea present. Right sinus exhibits maxillary sinus tenderness. Right sinus exhibits no frontal sinus tenderness. Left sinus exhibits maxillary sinus tenderness. Left sinus exhibits no frontal sinus tenderness.  Mouth/Throat: No oropharyngeal exudate or posterior oropharyngeal erythema.  Eyes: Conjunctivae are normal. Pupils are equal, round, and reactive to light.  Neck: No Brudzinski's sign noted. No thyromegaly present.  Cardiovascular: Normal rate and regular rhythm.   Pulmonary/Chest: Breath sounds normal. No respiratory distress.  Lymphadenopathy:       Head (right side): No preauricular adenopathy present.       Head (left side): No preauricular adenopathy present.    He has no cervical adenopathy.    Lab Results  Component Value Date  HGBA1C 5.5% 03/06/2013    Lab Results  Component Value Date   WBC 4.8 09/18/2014   HGB 15.4 09/18/2014   HCT 49.1 09/18/2014   GLUCOSE 113* 09/18/2014   CHOL 166 09/18/2014   TRIG 227* 09/18/2014   HDL 21* 09/18/2014   LDLCALC 107* 12/31/2013   ALT 22 09/18/2014   AST 20 09/18/2014   NA 138 09/18/2014   K 4.2 09/18/2014   CL 99 09/18/2014   CREATININE 0.78 09/18/2014   BUN 15 09/18/2014   CO2 23 09/18/2014   TSH 1.580 12/31/2013   PSA 1.4 09/18/2014   HGBA1C 5.5%  03/06/2013    No results found.  Assessment & Plan:   Nicholas Hawkins was seen today for sinusitis and ear pain.  Diagnoses and all orders for this visit:  Bilateral acute serous otitis media, recurrence not specified -     betamethasone acetate-betamethasone sodium phosphate (CELESTONE) injection 6 mg; Inject 1 mL (6 mg total) into the muscle once.  Acute maxillary sinusitis, recurrence not specified -     betamethasone acetate-betamethasone sodium phosphate (CELESTONE) injection 6 mg; Inject 1 mL (6 mg total) into the muscle once.  Other orders -     amoxicillin-clavulanate (AUGMENTIN) 875-125 MG per tablet; Take 1 tablet by mouth 2 (two) times daily. Take all of this medication  I am having Nicholas Hawkins start on amoxicillin-clavulanate. I am also having him maintain his atorvastatin, Vitamin D (Ergocalciferol), meclizine, lisinopril, and PARoxetine. We will continue to administer betamethasone acetate-betamethasone sodium phosphate.  Meds ordered this encounter  Medications  . amoxicillin-clavulanate (AUGMENTIN) 875-125 MG per tablet    Sig: Take 1 tablet by mouth 2 (two) times daily. Take all of this medication    Dispense:  20 tablet    Refill:  0  . betamethasone acetate-betamethasone sodium phosphate (CELESTONE) injection 6 mg    Sig:      Follow-up: No Follow-up on file.  Claretta Fraise, M.D.

## 2015-02-17 DIAGNOSIS — K045 Chronic apical periodontitis: Secondary | ICD-10-CM | POA: Diagnosis not present

## 2015-02-17 DIAGNOSIS — K046 Periapical abscess with sinus: Secondary | ICD-10-CM | POA: Diagnosis not present

## 2015-02-19 ENCOUNTER — Ambulatory Visit (INDEPENDENT_AMBULATORY_CARE_PROVIDER_SITE_OTHER): Payer: Commercial Managed Care - HMO | Admitting: Family Medicine

## 2015-02-19 ENCOUNTER — Encounter: Payer: Self-pay | Admitting: Family Medicine

## 2015-02-19 VITALS — BP 126/78 | HR 69 | Temp 97.7°F | Ht 72.0 in | Wt 258.0 lb

## 2015-02-19 DIAGNOSIS — R42 Dizziness and giddiness: Secondary | ICD-10-CM

## 2015-02-19 DIAGNOSIS — J301 Allergic rhinitis due to pollen: Secondary | ICD-10-CM | POA: Diagnosis not present

## 2015-02-19 DIAGNOSIS — I950 Idiopathic hypotension: Secondary | ICD-10-CM

## 2015-02-19 MED ORDER — FLUTICASONE PROPIONATE 50 MCG/ACT NA SUSP: NASAL | Status: DC

## 2015-02-19 NOTE — Patient Instructions (Addendum)
The patient will continue to drink plenty of fluids and keep himself well-hydrated He will record some blood pressure readings for review and bring these to the next visit He will check blood pressures when he is feeling well and when he is feeling lightheaded. Until he returns to the office he will just take one half of a 30 mg lisinopril He may have to take less blood pressure medicine in the summer and more in the winter due to his being outside a lot and his blood vessels being more vasodilated. Use nasal saline during the day 1 or 2 sprays each nostril and use the prescription Flonase at nighttime 1-2 sprays each nostril only at bedtime.

## 2015-02-19 NOTE — Progress Notes (Signed)
Subjective:    Patient ID: Nicholas Hawkins, male    DOB: 01/10/1936, 79 y.o.   MRN: [id number]  HPI Patient here today for dizziness that comes and goes. He states that it is worse in the heat and worse with standing. This is been going on for about 4 weeks. He's had some problems with his left ear and some nasal congestion and drainage also for about 4 weeks.      Patient Active Problem List   Diagnosis Date Noted  . Hearing loss 07/14/2013  . Need for pneumococcal vaccination 07/14/2013  . Hyperlipidemia   . Low testosterone   . Paresthesias 03/06/2013  . Dyslipidemia 03/06/2013  . Obesity, unspecified 03/01/2010  . HYPERTENSION, MILD 03/01/2010  . NECK PAIN 03/01/2010  . DIZZINESS AND GIDDINESS 02/28/2010  . Other fatigue 02/28/2010  . ALTERED MENTAL STATUS 02/28/2010  . HEADACHE 02/28/2010   Outpatient Encounter Prescriptions as of 02/19/2015  Medication Sig  . amoxicillin-clavulanate (AUGMENTIN) 875-125 MG per tablet Take 1 tablet by mouth 2 (two) times daily. Take all of this medication  . atorvastatin (LIPITOR) 40 MG tablet Take 1 tablet (40 mg total) by mouth daily. For cholesterol  . lisinopril (PRINIVIL,ZESTRIL) 30 MG tablet Take 1 tablet (30 mg total) by mouth daily.  Marland Kitchen PARoxetine (PAXIL) 20 MG tablet Take 1 tablet (20 mg total) by mouth every morning.  . Vitamin D, Ergocalciferol, (DRISDOL) 50000 UNITS CAPS capsule Take 1 capsule (50,000 Units total) by mouth 2 (two) times a week.  . [DISCONTINUED] meclizine (ANTIVERT) 32 MG tablet Take 1 tablet (32 mg total) by mouth 3 (three) times daily as needed.   No facility-administered encounter medications on file as of 02/19/2015.      Review of Systems  Constitutional: Negative.   HENT: Positive for ear discharge (feels "wet").   Eyes: Negative.   Respiratory: Negative.   Cardiovascular: Negative.   Gastrointestinal: Negative.   Endocrine: Negative.   Genitourinary: Negative.   Musculoskeletal: Negative.   Skin:  Negative.   Allergic/Immunologic: Negative.   Neurological: Positive for dizziness.  Hematological: Negative.   Psychiatric/Behavioral: Negative.        Objective:   Physical Exam  Constitutional: He is oriented to person, place, and time. He appears well-developed and well-nourished. No distress.  HENT:  Head: Normocephalic and atraumatic.  Right Ear: External ear normal.  Left Ear: External ear normal.  Mouth/Throat: Oropharynx is clear and moist. No oropharyngeal exudate.  Mild nasal congestion bilaterally with some puffiness around the eyes.  Eyes: Conjunctivae and EOM are normal. Pupils are equal, round, and reactive to light. Right eye exhibits no discharge. Left eye exhibits no discharge. No scleral icterus.  Neck: Normal range of motion. Neck supple. No thyromegaly present.  Cardiovascular: Normal rate, regular rhythm, normal heart sounds and intact distal pulses.   No murmur heard. Pulmonary/Chest: Effort normal and breath sounds normal. No respiratory distress. He has no wheezes. He has no rales. He exhibits no tenderness.  Clear anteriorly and posteriorly  Abdominal: Soft. Bowel sounds are normal. He exhibits no mass. There is no tenderness. There is no rebound and no guarding.  Obesity without masses or tenderness  Musculoskeletal: Normal range of motion. He exhibits no edema.  Lymphadenopathy:    He has no cervical adenopathy.  Neurological: He is alert and oriented to person, place, and time.  Skin: Skin is warm and dry. No rash noted.  Psychiatric: He has a normal mood and affect. His behavior is normal. Judgment  and thought content normal.  Nursing note and vitals reviewed.  BP 126/78 mmHg  Pulse 69  Temp(Src) 97.7 F (36.5 C) (Oral)  Ht 6' (1.829 m)  Wt 258 lb (117.028 kg)  BMI 34.98 kg/m2        Assessment & Plan:  1. Dizziness -Reduce blood pressure medicine for the next couple weeks to half of a pill daily -Record blood pressures at home both when  you're dizzy and when you're not dizzy - BMP8+EGFR - CBC with Differential/Platelet -Thyroid profile with TSH - fluticasone (FLONASE) 50 MCG/ACT nasal spray; One to 2 sprays each nostril at bedtime  Dispense: 16 g; Refill: 6  2. Allergic rhinitis due to pollen -Use prescription Flonase regularly at nighttime and use over-the-counter nasal saline during the day - fluticasone (FLONASE) 50 MCG/ACT nasal spray; One to 2 sprays each nostril at bedtime  Dispense: 16 g; Refill: 6  3. Idiopathic hypotension -Monitor blood pressures as directed and bring readings back for review in a couple weeks to the visit -At that point in time if taking one half of a 30 mg lisinopril results in the blood pressures being too high with may need to go back up to at least 20 mg of lisinopril.  Patient Instructions  The patient will continue to drink plenty of fluids and keep himself well-hydrated He will record some blood pressure readings for review and bring these to the next visit He will check blood pressures when he is feeling well and when he is feeling lightheaded. Until he returns to the office he will just take one half of a 30 mg lisinopril He may have to take less blood pressure medicine in the summer and more in the winter due to his being outside a lot and his blood vessels being more vasodilated. Use nasal saline during the day 1 or 2 sprays each nostril and use the prescription Flonase at nighttime 1-2 sprays each nostril only at bedtime.   Arrie Senate MD

## 2015-02-20 LAB — CBC WITH DIFFERENTIAL/PLATELET
BASOS ABS: 0 10*3/uL (ref 0.0–0.2)
Basos: 1 %
EOS (ABSOLUTE): 0.1 10*3/uL (ref 0.0–0.4)
Eos: 1 %
Hematocrit: 45.4 % (ref 37.5–51.0)
Hemoglobin: 15.5 g/dL (ref 12.6–17.7)
Immature Grans (Abs): 0 10*3/uL (ref 0.0–0.1)
Immature Granulocytes: 0 %
LYMPHS ABS: 2.5 10*3/uL (ref 0.7–3.1)
Lymphs: 37 %
MCH: 30.6 pg (ref 26.6–33.0)
MCHC: 34.1 g/dL (ref 31.5–35.7)
MCV: 90 fL (ref 79–97)
MONOCYTES: 11 %
Monocytes Absolute: 0.8 10*3/uL (ref 0.1–0.9)
NEUTROS ABS: 3.5 10*3/uL (ref 1.4–7.0)
Neutrophils: 50 %
PLATELETS: 243 10*3/uL (ref 150–379)
RBC: 5.07 x10E6/uL (ref 4.14–5.80)
RDW: 13.5 % (ref 12.3–15.4)
WBC: 7 10*3/uL (ref 3.4–10.8)

## 2015-02-20 LAB — BMP8+EGFR
BUN / CREAT RATIO: 14 (ref 10–22)
BUN: 13 mg/dL (ref 8–27)
CALCIUM: 9.5 mg/dL (ref 8.6–10.2)
CHLORIDE: 96 mmol/L — AB (ref 97–108)
CO2: 21 mmol/L (ref 18–29)
Creatinine, Ser: 0.94 mg/dL (ref 0.76–1.27)
GFR calc non Af Amer: 78 mL/min/{1.73_m2} (ref >59)
GFR, EST AFRICAN AMERICAN: 90 mL/min/{1.73_m2} (ref >59)
GLUCOSE: 74 mg/dL (ref 65–99)
POTASSIUM: 4.4 mmol/L (ref 3.5–5.2)
Sodium: 138 mmol/L (ref 134–144)

## 2015-02-20 LAB — THYROID PANEL WITH TSH
Free Thyroxine Index: 1.8 (ref 1.2–4.9)
T3 Uptake Ratio: 28 % (ref 24–39)
T4 TOTAL: 6.6 ug/dL (ref 4.5–12.0)
TSH: 1.59 u[IU]/mL (ref 0.450–4.500)

## 2015-03-04 ENCOUNTER — Encounter: Payer: Self-pay | Admitting: Family Medicine

## 2015-03-04 ENCOUNTER — Ambulatory Visit (INDEPENDENT_AMBULATORY_CARE_PROVIDER_SITE_OTHER): Payer: Commercial Managed Care - HMO | Admitting: Family Medicine

## 2015-03-04 VITALS — BP 149/72 | HR 66 | Temp 97.7°F | Ht 72.0 in | Wt 257.0 lb

## 2015-03-04 DIAGNOSIS — H6505 Acute serous otitis media, recurrent, left ear: Secondary | ICD-10-CM

## 2015-03-04 DIAGNOSIS — W57XXXA Bitten or stung by nonvenomous insect and other nonvenomous arthropods, initial encounter: Secondary | ICD-10-CM | POA: Diagnosis not present

## 2015-03-04 DIAGNOSIS — R42 Dizziness and giddiness: Secondary | ICD-10-CM | POA: Diagnosis not present

## 2015-03-04 DIAGNOSIS — S80922A Unspecified superficial injury of left lower leg, initial encounter: Secondary | ICD-10-CM | POA: Diagnosis not present

## 2015-03-04 DIAGNOSIS — S80921A Unspecified superficial injury of right lower leg, initial encounter: Secondary | ICD-10-CM | POA: Diagnosis not present

## 2015-03-04 DIAGNOSIS — I951 Orthostatic hypotension: Secondary | ICD-10-CM

## 2015-03-04 NOTE — Patient Instructions (Signed)
Return to clinic to see Tiffany to have her do a skin check Be sure and use deep [place] off before walking in the woods on your lower extremities and clothing to help prevent tick bites and mite bites Continue for the time being half of a lisinopril 30 which would be 15 mg daily Bring blood pressure monitor to the office and compare home readings and in office readings to readings with our monitor Continue with the Flonase and nasal saline For short period of time because of the year being stopped up try either Claritin or Allegra once daily

## 2015-03-04 NOTE — Progress Notes (Signed)
Subjective:    Patient ID: Nicholas Hawkins, male    DOB: [birth year], 79 y.o.   MRN: [id number]  HPI Patient here today for 2 week follow up on dizziness, hypotension and allergic rhinitis. He is feeling better. The patient recently had his lisinopril in half as only taking 15 mg now. He does feel better as having less dizziness. He still is having complaints with his left ear stopping up. He did take all of his antibiotic. The patient's blood pressure readings were not brought in today. He also complains of mite bites on his lower legs. He says he has more energy since he has reduced the blood pressure medicine.       Patient Active Problem List   Diagnosis Date Noted  . Hearing loss 07/14/2013  . Need for pneumococcal vaccination 07/14/2013  . Hyperlipidemia   . Low testosterone   . Paresthesias 03/06/2013  . Dyslipidemia 03/06/2013  . Obesity, unspecified 03/01/2010  . HYPERTENSION, MILD 03/01/2010  . NECK PAIN 03/01/2010  . DIZZINESS AND GIDDINESS 02/28/2010  . Other fatigue 02/28/2010  . ALTERED MENTAL STATUS 02/28/2010  . HEADACHE 02/28/2010   Outpatient Encounter Prescriptions as of 03/04/2015  Medication Sig  . atorvastatin (LIPITOR) 40 MG tablet Take 1 tablet (40 mg total) by mouth daily. For cholesterol  . fluticasone (FLONASE) 50 MCG/ACT nasal spray One to 2 sprays each nostril at bedtime  . lisinopril (PRINIVIL,ZESTRIL) 30 MG tablet Take 1 tablet (30 mg total) by mouth daily.  Marland Kitchen PARoxetine (PAXIL) 20 MG tablet Take 1 tablet (20 mg total) by mouth every morning.  . Vitamin D, Ergocalciferol, (DRISDOL) 50000 UNITS CAPS capsule Take 1 capsule (50,000 Units total) by mouth 2 (two) times a week.  . [DISCONTINUED] amoxicillin-clavulanate (AUGMENTIN) 875-125 MG per tablet Take 1 tablet by mouth 2 (two) times daily. Take all of this medication   No facility-administered encounter medications on file as of 03/04/2015.      Review of Systems  Constitutional: Negative.     HENT: Negative.        Left ear stops up  Eyes: Negative.   Respiratory: Negative.   Cardiovascular: Negative.   Gastrointestinal: Negative.   Endocrine: Negative.   Genitourinary: Negative.   Musculoskeletal: Negative.   Skin: Negative.   Allergic/Immunologic: Negative.   Neurological: Negative.   Hematological: Negative.   Psychiatric/Behavioral: Negative.        Objective:   Physical Exam  Constitutional: He is oriented to person, place, and time. He appears well-developed and well-nourished. No distress.  HENT:  Head: Normocephalic and atraumatic.  Right Ear: External ear normal.  Left Ear: External ear normal.  Nose: Nose normal.  Mouth/Throat: Oropharynx is clear and moist. No oropharyngeal exudate.  The left TM appears retracted compared to the right  Eyes: Conjunctivae and EOM are normal. Pupils are equal, round, and reactive to light. Right eye exhibits no discharge. Left eye exhibits no discharge. No scleral icterus.  Neck: Normal range of motion. Neck supple. No thyromegaly present.  Cardiovascular: Normal rate, regular rhythm and normal heart sounds.   No murmur heard. Pulmonary/Chest: Effort normal and breath sounds normal. No respiratory distress. He has no wheezes. He has no rales. He exhibits no tenderness.  Abdominal: Bowel sounds are normal.  Genitourinary: Rectum normal.  Musculoskeletal: Normal range of motion. He exhibits no edema or tenderness.  Lymphadenopathy:    He has no cervical adenopathy.  Neurological: He is alert and oriented to person, place, and time.  Skin:  Skin is warm and dry. No rash noted. No erythema. No pallor.  Multiple bites of both lower extremities and upper extremities.  Psychiatric: He has a normal mood and affect. His behavior is normal. Judgment and thought content normal.  Nursing note and vitals reviewed.  BP 149/72 mmHg  Pulse 66  Temp(Src) 97.7 F (36.5 C) (Oral)  Ht 6' (1.829 m)  Wt 257 lb (116.574 kg)  BMI 34.85  kg/m2  Repeat blood pressure 120/78 right arm sitting large cuff    Assessment & Plan:  1. Dizziness and giddiness -This is improved since reducing his lisinopril from 30-15 mg. -He will continue to monitor his blood pressures at home and bring readings to the office and he will also bring his monitor for comparison.  2. Orthostatic hypotension -This is improved and blood pressure is normalized  3. Insect bites -No specific treatment for this today but to avoid future bites the patient will use deep [place] off prior to going out in the woods by spraying it on his lower extremities and his clothing.  4. Recurrent acute serous otitis media of left ear -The left eardrum is retracted and he still feels like his ear is stopped up. It is better. He will continue with nasal saline and Flonase in for a few days will take either Allegra or Claritin to see if this may help decongest him further.  Patient Instructions  Return to clinic to see Tiffany to have her do a skin check Be sure and use deep [place] off before walking in the woods on your lower extremities and clothing to help prevent tick bites and mite bites Continue for the time being half of a lisinopril 30 which would be 15 mg daily Bring blood pressure monitor to the office and compare home readings and in office readings to readings with our monitor Continue with the Flonase and nasal saline For short period of time because of the year being stopped up try either Claritin or Allegra once daily   Arrie Senate MD

## 2015-04-30 ENCOUNTER — Ambulatory Visit (INDEPENDENT_AMBULATORY_CARE_PROVIDER_SITE_OTHER): Payer: Commercial Managed Care - HMO | Admitting: Family Medicine

## 2015-04-30 ENCOUNTER — Emergency Department (HOSPITAL_COMMUNITY): Payer: Medicare PPO

## 2015-04-30 ENCOUNTER — Encounter: Payer: Self-pay | Admitting: Family Medicine

## 2015-04-30 ENCOUNTER — Encounter (HOSPITAL_COMMUNITY): Payer: Self-pay | Admitting: Emergency Medicine

## 2015-04-30 ENCOUNTER — Encounter (INDEPENDENT_AMBULATORY_CARE_PROVIDER_SITE_OTHER): Payer: Self-pay

## 2015-04-30 ENCOUNTER — Emergency Department (HOSPITAL_COMMUNITY)
Admission: EM | Admit: 2015-04-30 | Discharge: 2015-04-30 | Disposition: A | Discharge status: Home / Self Care | Payer: Medicare PPO | Attending: Emergency Medicine | Admitting: Emergency Medicine

## 2015-04-30 VITALS — BP 145/75 | HR 55 | Temp 97.0°F | Ht 72.0 in | Wt 258.8 lb

## 2015-04-30 DIAGNOSIS — R42 Dizziness and giddiness: Secondary | ICD-10-CM | POA: Diagnosis not present

## 2015-04-30 DIAGNOSIS — Y9389 Activity, other specified: Secondary | ICD-10-CM | POA: Diagnosis not present

## 2015-04-30 DIAGNOSIS — I10 Essential (primary) hypertension: Secondary | ICD-10-CM | POA: Diagnosis not present

## 2015-04-30 DIAGNOSIS — T148 Other injury of unspecified body region: Secondary | ICD-10-CM | POA: Diagnosis not present

## 2015-04-30 DIAGNOSIS — Z23 Encounter for immunization: Secondary | ICD-10-CM

## 2015-04-30 DIAGNOSIS — E785 Hyperlipidemia, unspecified: Secondary | ICD-10-CM

## 2015-04-30 DIAGNOSIS — Z79899 Other long term (current) drug therapy: Secondary | ICD-10-CM | POA: Insufficient documentation

## 2015-04-30 DIAGNOSIS — F329 Major depressive disorder, single episode, unspecified: Secondary | ICD-10-CM

## 2015-04-30 DIAGNOSIS — S299XXA Unspecified injury of thorax, initial encounter: Secondary | ICD-10-CM | POA: Diagnosis not present

## 2015-04-30 DIAGNOSIS — Z7951 Long term (current) use of inhaled steroids: Secondary | ICD-10-CM | POA: Insufficient documentation

## 2015-04-30 DIAGNOSIS — S0990XA Unspecified injury of head, initial encounter: Secondary | ICD-10-CM | POA: Diagnosis not present

## 2015-04-30 DIAGNOSIS — Y92410 Unspecified street and highway as the place of occurrence of the external cause: Secondary | ICD-10-CM | POA: Insufficient documentation

## 2015-04-30 DIAGNOSIS — Y998 Other external cause status: Secondary | ICD-10-CM | POA: Insufficient documentation

## 2015-04-30 DIAGNOSIS — J301 Allergic rhinitis due to pollen: Secondary | ICD-10-CM

## 2015-04-30 DIAGNOSIS — S3991XA Unspecified injury of abdomen, initial encounter: Secondary | ICD-10-CM | POA: Diagnosis not present

## 2015-04-30 DIAGNOSIS — S20211A Contusion of right front wall of thorax, initial encounter: Secondary | ICD-10-CM | POA: Insufficient documentation

## 2015-04-30 DIAGNOSIS — S199XXA Unspecified injury of neck, initial encounter: Secondary | ICD-10-CM | POA: Diagnosis not present

## 2015-04-30 DIAGNOSIS — E669 Obesity, unspecified: Secondary | ICD-10-CM | POA: Insufficient documentation

## 2015-04-30 DIAGNOSIS — F32A Depression, unspecified: Secondary | ICD-10-CM

## 2015-04-30 DIAGNOSIS — M542 Cervicalgia: Secondary | ICD-10-CM | POA: Diagnosis not present

## 2015-04-30 DIAGNOSIS — R079 Chest pain, unspecified: Secondary | ICD-10-CM | POA: Diagnosis not present

## 2015-04-30 LAB — I-STAT CHEM 8, ED
BUN: 24 mg/dL — AB (ref 6–20)
CALCIUM ION: 1.11 mmol/L — AB (ref 1.13–1.30)
Chloride: 104 mmol/L (ref 101–111)
Creatinine, Ser: 0.9 mg/dL (ref 0.61–1.24)
Glucose, Bld: 89 mg/dL (ref 65–99)
HCT: 46 % (ref 39.0–52.0)
Hemoglobin: 15.6 g/dL (ref 13.0–17.0)
Potassium: 3.3 mmol/L — ABNORMAL LOW (ref 3.5–5.1)
SODIUM: 141 mmol/L (ref 135–145)
TCO2: 23 mmol/L (ref 0–100)

## 2015-04-30 LAB — COMPREHENSIVE METABOLIC PANEL
ALT: 30 U/L (ref 17–63)
ANION GAP: 8 (ref 5–15)
AST: 28 U/L (ref 15–41)
Albumin: 4.1 g/dL (ref 3.5–5.0)
Alkaline Phosphatase: 100 U/L (ref 38–126)
BILIRUBIN TOTAL: 0.3 mg/dL (ref 0.3–1.2)
BUN: 21 mg/dL — ABNORMAL HIGH (ref 6–20)
CO2: 24 mmol/L (ref 22–32)
Calcium: 9.1 mg/dL (ref 8.9–10.3)
Chloride: 108 mmol/L (ref 101–111)
Creatinine, Ser: 0.95 mg/dL (ref 0.61–1.24)
Glucose, Bld: 89 mg/dL (ref 65–99)
POTASSIUM: 3.5 mmol/L (ref 3.5–5.1)
Sodium: 140 mmol/L (ref 135–145)
TOTAL PROTEIN: 6.9 g/dL (ref 6.5–8.1)

## 2015-04-30 LAB — PROTIME-INR
INR: 1.05 (ref 0.00–1.49)
PROTHROMBIN TIME: 13.9 s (ref 11.6–15.2)

## 2015-04-30 LAB — CBC
HEMATOCRIT: 43.6 % (ref 39.0–52.0)
HEMOGLOBIN: 14.8 g/dL (ref 13.0–17.0)
MCH: 30.4 pg (ref 26.0–34.0)
MCHC: 33.9 g/dL (ref 30.0–36.0)
MCV: 89.5 fL (ref 78.0–100.0)
Platelets: 171 10*3/uL (ref 150–400)
RBC: 4.87 MIL/uL (ref 4.22–5.81)
RDW: 13 % (ref 11.5–15.5)
WBC: 6.4 10*3/uL (ref 4.0–10.5)

## 2015-04-30 LAB — SAMPLE TO BLOOD BANK

## 2015-04-30 LAB — CDS SEROLOGY

## 2015-04-30 LAB — ETHANOL: Alcohol, Ethyl (B): <5 mg/dL (ref <5)

## 2015-04-30 MED ORDER — IOHEXOL 300 MG/ML  SOLN
100.0000 mL | Freq: Once | INTRAMUSCULAR | Status: AC | PRN
  Administered 2015-04-30: 100 mL via INTRAVENOUS

## 2015-04-30 MED ORDER — FLUTICASONE PROPIONATE 50 MCG/ACT NA SUSP: NASAL | Status: DC

## 2015-04-30 MED ORDER — LISINOPRIL 30 MG PO TABS: 30.0000 mg | ORAL_TABLET | Freq: Every day | ORAL | Status: DC

## 2015-04-30 MED ORDER — PAROXETINE HCL 20 MG PO TABS
20.0000 mg | ORAL_TABLET | ORAL | Status: DC
Start: 2015-04-30 — End: 2015-10-04
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
## 2015-04-30 NOTE — Patient Instructions (Signed)
Please go back to taking a whole blood pressure pill every day!  DASH Eating Plan DASH stands for "Dietary Approaches to Stop Hypertension." The DASH eating plan is a healthy eating plan that has been shown to reduce high blood pressure (hypertension). Additional health benefits may include reducing the risk of type 2 diabetes mellitus, heart disease, and stroke. The DASH eating plan may also help with weight loss. WHAT DO I NEED TO KNOW ABOUT THE DASH EATING PLAN? For the DASH eating plan, you will follow these general guidelines:  Choose foods with a percent daily value for sodium of less than 5% (as listed on the food label).  Use salt-free seasonings or herbs instead of table salt or sea salt.  Check with your health care provider or pharmacist before using salt substitutes.  Eat lower-sodium products, often labeled as "lower sodium" or "no salt added."  Eat fresh foods.  Eat more vegetables, fruits, and low-fat dairy products.  Choose whole grains. Look for the word "whole" as the first word in the ingredient list.  Choose fish and skinless chicken or [place] more often than red meat. Limit fish, poultry, and meat to 6 oz (170 g) each day.  Limit sweets, desserts, sugars, and sugary drinks.  Choose heart-healthy fats.  Limit cheese to 1 oz (28 g) per day.  Eat more home-cooked food and less restaurant, buffet, and fast food.  Limit fried foods.  Cook foods using methods other than frying.  Limit canned vegetables. If you do use them, rinse them well to decrease the sodium.  When eating at a restaurant, ask that your food be prepared with less salt, or no salt if possible. WHAT FOODS CAN I EAT? Seek help from a dietitian for individual calorie needs. Grains Whole grain or whole wheat bread. Brown rice. Whole grain or whole wheat pasta. Quinoa, bulgur, and whole grain cereals. Low-sodium cereals. Corn or whole wheat flour tortillas. Whole grain cornbread. Whole grain  crackers. Low-sodium crackers. Vegetables Fresh or frozen vegetables (raw, steamed, roasted, or grilled). Low-sodium or reduced-sodium tomato and vegetable juices. Low-sodium or reduced-sodium tomato sauce and paste. Low-sodium or reduced-sodium canned vegetables.  Fruits All fresh, canned (in natural juice), or frozen fruits. Meat and Other Protein Products Ground beef (85% or leaner), grass-fed beef, or beef trimmed of fat. Skinless chicken or [place]. Ground chicken or [place]. Pork trimmed of fat. All fish and seafood. Eggs. Dried beans, peas, or lentils. Unsalted nuts and seeds. Unsalted canned beans. Dairy Low-fat dairy products, such as skim or 1% milk, 2% or reduced-fat cheeses, low-fat ricotta or cottage cheese, or plain low-fat yogurt. Low-sodium or reduced-sodium cheeses. Fats and Oils Tub margarines without trans fats. Light or reduced-fat mayonnaise and salad dressings (reduced sodium). Avocado. Safflower, olive, or canola oils. Natural peanut or almond butter. Other Unsalted popcorn and pretzels. The items listed above may not be a complete list of recommended foods or beverages. Contact your dietitian for more options. WHAT FOODS ARE NOT RECOMMENDED? Grains White bread. White pasta. White rice. Refined cornbread. Bagels and croissants. Crackers that contain trans fat. Vegetables Creamed or fried vegetables. Vegetables in a cheese sauce. Regular canned vegetables. Regular canned tomato sauce and paste. Regular tomato and vegetable juices. Fruits Dried fruits. Canned fruit in light or heavy syrup. Fruit juice. Meat and Other Protein Products Fatty cuts of meat. Ribs, chicken wings, bacon, sausage, bologna, salami, chitterlings, fatback, hot dogs, bratwurst, and packaged luncheon meats. Salted nuts and seeds. Canned beans with salt. Dairy Whole  or 2% milk, cream, half-and-half, and cream cheese. Whole-fat or sweetened yogurt. Full-fat cheeses or blue cheese. Nondairy creamers and  whipped toppings. Processed cheese, cheese spreads, or cheese curds. Condiments Onion and garlic salt, seasoned salt, table salt, and sea salt. Canned and packaged gravies. Worcestershire sauce. Tartar sauce. Barbecue sauce. Teriyaki sauce. Soy sauce, including reduced sodium. Steak sauce. Fish sauce. Oyster sauce. Cocktail sauce. Horseradish. Ketchup and mustard. Meat flavorings and tenderizers. Bouillon cubes. Hot sauce. Tabasco sauce. Marinades. Taco seasonings. Relishes. Fats and Oils Butter, stick margarine, lard, shortening, ghee, and bacon fat. Coconut, palm kernel, or palm oils. Regular salad dressings. Other Pickles and olives. Salted popcorn and pretzels. The items listed above may not be a complete list of foods and beverages to avoid. Contact your dietitian for more information. WHERE CAN I FIND MORE INFORMATION? National Heart, [employer], and [employer] Institute: travelstabloid.com   This information is not intended to replace advice given to you by your health care provider. Make sure you discuss any questions you have with your health care provider.   Document Released: 05/18/2011 Document Revised: 06/19/2014 Document Reviewed: 04/02/2013 Elsevier Interactive Patient Education [employer].

## 2015-04-30 NOTE — Progress Notes (Signed)
Subjective:  Patient ID: Nicholas Hawkins, male    DOB: 09-03-1935  Age: 79 y.o. MRN: [id number]  CC: Hypertension   HPI Nicholas Hawkins presents for  follow-up of hypertension. Patient has no history of headache chest pain or shortness of breath or recent cough. Patient also denies symptoms of TIA such as numbness weakness lateralizing. Patient checks  blood pressure at home and has not had any elevated readings recently. Patient denies side effects from his medication. States taking it regularly.  Patient in for follow-up of elevated cholesterol. Doing well without complaints on current medication. Denies side effects of statin including myalgia and arthralgia and nausea. Also in today for liver function testing. Currently no chest pain, shortness of breath or other cardiovascular related symptoms noted.  Depression screen [facility] 2/9 04/30/2015 03/04/2015 01/25/2015 12/29/2014 09/18/2014  Decreased Interest 0 0 0 0 0  Down, Depressed, Hopeless 0 0 0 0 0  PHQ - 2 Score 0 0 0 0 0    GAD 7 : Generalized Anxiety Score 04/30/2015  Nervous, Anxious, on Edge 0  Control/stop worrying 0  Worry too much - different things 0  Trouble relaxing 0  Restless 0  Easily annoyed or irritable 0  Afraid - awful might happen 0  Total GAD 7 Score 0    Above noted. Pt. relies on his paroxetine to help him remain stable emotionally and psychologically. He denies any side effects of the medication. He has been taking it regularly for several years and does not want to make a change. Last time he tried to discontinue it was several years ago and he quickly relapsed into depression with anxiety.    History Nicholas Hawkins has a past medical history of Obesity, unspecified; HYPERTENSION, MILD; Hyperlipidemia; Low testosterone; and Obesity.   He has past surgical history that includes Appendectomy.   His family history includes Heart disease in his mother.He reports that he has never smoked. He does not have any smokeless  tobacco history on file. He reports that he does not drink alcohol or use illicit drugs.  Current Outpatient Prescriptions on File Prior to Visit  Medication Sig Dispense Refill  . atorvastatin (LIPITOR) 40 MG tablet Take 1 tablet (40 mg total) by mouth daily. For cholesterol 90 tablet 3   No current facility-administered medications on file prior to visit.    ROS Review of Systems  Constitutional: Negative for fever, chills and diaphoresis.  HENT: Negative for congestion, rhinorrhea and sore throat.   Respiratory: Negative for cough, shortness of breath and wheezing.   Cardiovascular: Negative for chest pain.  Gastrointestinal: Negative for nausea, vomiting, abdominal pain, diarrhea, constipation and abdominal distention.  Genitourinary: Negative for dysuria and frequency.  Musculoskeletal: Negative for joint swelling and arthralgias.  Skin: Negative for rash.  Neurological: Negative for headaches.    Objective:  BP 145/75 mmHg  Pulse 55  Temp(Src) 97 F (36.1 C) (Oral)  Ht 6' (1.829 m)  Wt 258 lb 12.8 oz (117.391 kg)  BMI 35.09 kg/m2  SpO2 98%  BP Readings from Last 3 Encounters:  04/30/15 135/85  04/30/15 145/75  03/04/15 149/72    Wt Readings from Last 3 Encounters:  04/30/15 258 lb 12.8 oz (117.391 kg)  03/04/15 257 lb (116.574 kg)  02/19/15 258 lb (117.028 kg)     Physical Exam  Constitutional: He is oriented to person, place, and time. He appears well-developed and well-nourished. No distress.  Obese  HENT:  Head: Normocephalic and atraumatic.  Right Ear:  External ear normal.  Left Ear: External ear normal.  Nose: Nose normal.  Mouth/Throat: Oropharynx is clear and moist.  Eyes: Conjunctivae and EOM are normal. Pupils are equal, round, and reactive to light.  Neck: Normal range of motion. Neck supple. No thyromegaly present.  Cardiovascular: Normal rate, regular rhythm and normal heart sounds.   No murmur heard. Pulmonary/Chest: Effort normal and  breath sounds normal. No respiratory distress. He has no wheezes. He has no rales.  Abdominal: Soft. Bowel sounds are normal. He exhibits no distension. There is no tenderness.  Lymphadenopathy:    He has no cervical adenopathy.  Neurological: He is alert and oriented to person, place, and time. He has normal reflexes.  Skin: Skin is warm and dry.  Psychiatric: He has a normal mood and affect. His behavior is normal. Judgment and thought content normal.    Lab Results  Component Value Date   HGBA1C 5.5% 03/06/2013    Lab Results  Component Value Date   WBC 4.5 04/30/2015   HGB 15.6 04/30/2015   HCT 43.1 04/30/2015   PLT 171 04/30/2015   GLUCOSE 107* 04/30/2015   CHOL 140 04/30/2015   TRIG 159* 04/30/2015   HDL 24* 04/30/2015   LDLCALC 84 04/30/2015   ALT 26 04/30/2015   AST 21 04/30/2015   NA 141 04/30/2015   K 4.1 04/30/2015   CL 101 04/30/2015   CREATININE 0.71* 04/30/2015   BUN 19 04/30/2015   CO2 24 04/30/2015   TSH 1.590 02/19/2015   PSA 1.4 09/18/2014   INR 1.05 04/30/2015   HGBA1C 5.5% 03/06/2013    No results found.  Assessment & Plan:   Nicholas Hawkins was seen today for hypertension.  Diagnoses and all orders for this visit:  HYPERTENSION, MILD -     lisinopril (PRINIVIL,ZESTRIL) 30 MG tablet; Take 1 tablet (30 mg total) by mouth daily. -     CBC with Differential/Platelet -     CMP14+EGFR  Depression -     PARoxetine (PAXIL) 20 MG tablet; Take 1 tablet (20 mg total) by mouth every morning.  Encounter for immunization  Dizziness -     fluticasone (FLONASE) 50 MCG/ACT nasal spray; One to 2 sprays each nostril at bedtime  Non-seasonal allergic rhinitis due to pollen -     fluticasone (FLONASE) 50 MCG/ACT nasal spray; One to 2 sprays each nostril at bedtime  Hyperlipidemia -     Lipid panel  Other orders -     Flu Vaccine QUAD 36+ mos IM   I have discontinued [name] Vitamin D (Ergocalciferol). I am also having him maintain his atorvastatin,  lisinopril, PARoxetine, and fluticasone.  Meds ordered this encounter  Medications  . lisinopril (PRINIVIL,ZESTRIL) 30 MG tablet    Sig: Take 1 tablet (30 mg total) by mouth daily.    Dispense:  90 tablet    Refill:  1  . PARoxetine (PAXIL) 20 MG tablet    Sig: Take 1 tablet (20 mg total) by mouth every morning.    Dispense:  90 tablet    Refill:  1  . fluticasone (FLONASE) 50 MCG/ACT nasal spray    Sig: One to 2 sprays each nostril at bedtime    Dispense:  16 g    Refill:  6   Continue OTC vitamin D 2000 units daily  Follow-up: Return in about 4 weeks (around 05/28/2015) for hypertension.  Claretta Fraise, M.D.
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
## 2015-05-01 LAB — CMP14+EGFR
ALBUMIN: 4.2 g/dL (ref 3.5–4.8)
ALT: 26 IU/L (ref 0–44)
AST: 21 IU/L (ref 0–40)
Albumin/Globulin Ratio: 1.8 (ref 1.1–2.5)
Alkaline Phosphatase: 106 IU/L (ref 39–117)
BILIRUBIN TOTAL: 0.3 mg/dL (ref 0.0–1.2)
BUN / CREAT RATIO: 27 — AB (ref 10–22)
BUN: 19 mg/dL (ref 8–27)
CHLORIDE: 101 mmol/L (ref 97–106)
CO2: 24 mmol/L (ref 18–29)
CREATININE: 0.71 mg/dL — AB (ref 0.76–1.27)
Calcium: 8.9 mg/dL (ref 8.6–10.2)
GFR calc non Af Amer: 90 mL/min/{1.73_m2} (ref >59)
GFR, EST AFRICAN AMERICAN: 105 mL/min/{1.73_m2} (ref >59)
GLUCOSE: 107 mg/dL — AB (ref 65–99)
Globulin, Total: 2.3 g/dL (ref 1.5–4.5)
Potassium: 4.1 mmol/L (ref 3.5–5.2)
Sodium: 141 mmol/L (ref 136–144)
TOTAL PROTEIN: 6.5 g/dL (ref 6.0–8.5)

## 2015-05-01 LAB — LIPID PANEL
Chol/HDL Ratio: 5.8 ratio units — ABNORMAL HIGH (ref 0.0–5.0)
Cholesterol, Total: 140 mg/dL (ref 100–199)
HDL: 24 mg/dL — ABNORMAL LOW (ref >39)
LDL CALC: 84 mg/dL (ref 0–99)
Triglycerides: 159 mg/dL — ABNORMAL HIGH (ref 0–149)
VLDL CHOLESTEROL CAL: 32 mg/dL (ref 5–40)

## 2015-05-01 LAB — CBC WITH DIFFERENTIAL/PLATELET
BASOS ABS: 0 10*3/uL (ref 0.0–0.2)
Basos: 0 %
EOS (ABSOLUTE): 0 10*3/uL (ref 0.0–0.4)
Eos: 1 %
HEMOGLOBIN: 14.3 g/dL (ref 12.6–17.7)
Hematocrit: 43.1 % (ref 37.5–51.0)
Immature Grans (Abs): 0 10*3/uL (ref 0.0–0.1)
Immature Granulocytes: 0 %
LYMPHS ABS: 1.4 10*3/uL (ref 0.7–3.1)
Lymphs: 32 %
MCH: 30.1 pg (ref 26.6–33.0)
MCHC: 33.2 g/dL (ref 31.5–35.7)
MCV: 91 fL (ref 79–97)
MONOS ABS: 0.4 10*3/uL (ref 0.1–0.9)
Monocytes: 8 %
NEUTROS ABS: 2.6 10*3/uL (ref 1.4–7.0)
Neutrophils: 59 %
Platelets: 179 10*3/uL (ref 150–379)
RBC: 4.75 x10E6/uL (ref 4.14–5.80)
RDW: 13.6 % (ref 12.3–15.4)
WBC: 4.5 10*3/uL (ref 3.4–10.8)

## 2015-05-13 ENCOUNTER — Encounter: Payer: Self-pay | Admitting: Family Medicine

## 2015-05-13 ENCOUNTER — Ambulatory Visit (INDEPENDENT_AMBULATORY_CARE_PROVIDER_SITE_OTHER): Payer: Commercial Managed Care - HMO

## 2015-05-13 ENCOUNTER — Ambulatory Visit (INDEPENDENT_AMBULATORY_CARE_PROVIDER_SITE_OTHER): Payer: Commercial Managed Care - HMO | Admitting: Family Medicine

## 2015-05-13 VITALS — BP 161/86 | HR 59 | Temp 97.5°F | Ht 72.0 in | Wt 256.2 lb

## 2015-05-13 DIAGNOSIS — J329 Chronic sinusitis, unspecified: Secondary | ICD-10-CM | POA: Diagnosis not present

## 2015-05-13 DIAGNOSIS — R0789 Other chest pain: Secondary | ICD-10-CM

## 2015-05-13 MED ORDER — PREDNISONE 20 MG PO TABS
ORAL_TABLET | ORAL | Status: DC
Start: 2015-05-13 — End: 2015-05-20
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
## 2015-05-18 ENCOUNTER — Ambulatory Visit: Payer: Commercial Managed Care - HMO | Admitting: Family Medicine

## 2015-05-19 ENCOUNTER — Encounter: Payer: Self-pay | Admitting: Family Medicine

## 2015-05-20 ENCOUNTER — Ambulatory Visit (INDEPENDENT_AMBULATORY_CARE_PROVIDER_SITE_OTHER): Payer: Commercial Managed Care - HMO | Admitting: Family Medicine

## 2015-05-20 ENCOUNTER — Encounter: Payer: Self-pay | Admitting: Family Medicine

## 2015-05-20 VITALS — BP 139/85 | HR 65 | Temp 98.0°F | Ht 72.0 in | Wt 258.5 lb

## 2015-05-20 DIAGNOSIS — R059 Cough, unspecified: Secondary | ICD-10-CM

## 2015-05-20 DIAGNOSIS — R0789 Other chest pain: Secondary | ICD-10-CM | POA: Diagnosis not present

## 2015-05-20 DIAGNOSIS — R05 Cough: Secondary | ICD-10-CM

## 2015-05-20 MED ORDER — HYDROCODONE-HOMATROPINE 5-1.5 MG/5ML PO SYRP: 5.0000 mL | ORAL_SOLUTION | Freq: Four times a day (QID) | ORAL | Status: DC | PRN
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
## 2015-06-09 ENCOUNTER — Ambulatory Visit (INDEPENDENT_AMBULATORY_CARE_PROVIDER_SITE_OTHER): Payer: Commercial Managed Care - HMO | Admitting: Family Medicine

## 2015-06-09 ENCOUNTER — Encounter: Payer: Self-pay | Admitting: Family Medicine

## 2015-06-09 VITALS — BP 138/82 | HR 77 | Temp 98.8°F | Ht 72.0 in | Wt 259.8 lb

## 2015-06-09 DIAGNOSIS — S20212D Contusion of left front wall of thorax, subsequent encounter: Secondary | ICD-10-CM | POA: Diagnosis not present

## 2015-06-09 DIAGNOSIS — J321 Chronic frontal sinusitis: Secondary | ICD-10-CM

## 2015-06-09 MED ORDER — MELOXICAM 7.5 MG PO TABS: 7.5000 mg | ORAL_TABLET | Freq: Every day | ORAL | Status: DC
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
## 2015-06-15 ENCOUNTER — Other Ambulatory Visit: Payer: Self-pay | Admitting: Family Medicine

## 2015-06-25 ENCOUNTER — Encounter: Payer: Self-pay | Admitting: Family Medicine

## 2015-06-25 ENCOUNTER — Ambulatory Visit (INDEPENDENT_AMBULATORY_CARE_PROVIDER_SITE_OTHER): Payer: Commercial Managed Care - HMO | Admitting: Family Medicine

## 2015-06-25 VITALS — BP 134/86 | HR 93 | Temp 98.2°F | Ht 72.0 in | Wt 259.0 lb

## 2015-06-25 DIAGNOSIS — R071 Chest pain on breathing: Secondary | ICD-10-CM | POA: Diagnosis not present

## 2015-06-25 DIAGNOSIS — R0789 Other chest pain: Secondary | ICD-10-CM

## 2015-06-25 MED ORDER — NAPROXEN 500 MG PO TABS: 500.0000 mg | ORAL_TABLET | Freq: Two times a day (BID) | ORAL | Status: DC

## 2015-06-25 MED ORDER — TIZANIDINE HCL 2 MG PO CAPS: 2.0000 mg | ORAL_CAPSULE | Freq: Three times a day (TID) | ORAL | Status: DC | PRN
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
## 2015-06-28 DIAGNOSIS — L570 Actinic keratosis: Secondary | ICD-10-CM | POA: Diagnosis not present

## 2015-06-28 DIAGNOSIS — Z85828 Personal history of other malignant neoplasm of skin: Secondary | ICD-10-CM | POA: Diagnosis not present

## 2015-07-07 ENCOUNTER — Ambulatory Visit (HOSPITAL_COMMUNITY): Admission: RE | Admit: 2015-07-07 | Payer: No Typology Code available for payment source | Source: Ambulatory Visit

## 2015-07-29 ENCOUNTER — Ambulatory Visit (INDEPENDENT_AMBULATORY_CARE_PROVIDER_SITE_OTHER): Payer: Medicare PPO | Admitting: Otolaryngology

## 2015-09-25 ENCOUNTER — Ambulatory Visit (INDEPENDENT_AMBULATORY_CARE_PROVIDER_SITE_OTHER): Payer: Commercial Managed Care - HMO | Admitting: Family Medicine

## 2015-09-25 ENCOUNTER — Encounter: Payer: Self-pay | Admitting: Family Medicine

## 2015-09-25 DIAGNOSIS — J301 Allergic rhinitis due to pollen: Secondary | ICD-10-CM

## 2015-09-25 MED ORDER — FLUTICASONE PROPIONATE 50 MCG/ACT NA SUSP: NASAL | Status: DC

## 2015-09-25 MED ORDER — METHYLPREDNISOLONE ACETATE 80 MG/ML IJ SUSP
80.0000 mg | Freq: Once | INTRAMUSCULAR | Status: AC
  Administered 2015-09-25: 80 mg via INTRAMUSCULAR

## 2015-09-25 NOTE — Progress Notes (Signed)
BP 136/85 mmHg  Pulse 59  Temp(Src) 98 F (36.7 C) (Oral)  Ht 6' (1.829 m)  Wt 252 lb 3.2 oz (114.397 kg)  BMI 34.20 kg/m2   Subjective:    Patient ID: Nicholas Hawkins, male    DOB: May 04, 1936, 80 y.o.   MRN: [id number]  HPI: Nicholas Hawkins is a 80 y.o. male presenting on 09/25/2015 for Sinusitis   HPI Allergic sinusitis Patient is coming in today because of allergies flaring up his sinus congestion and postnasal drainage and he has a cough that is nonproductive but worse at night. He denies any fevers or chills or shortness of breath or wheezing. He does feel like it is starting to drain down into his chest a little bit. He has not had any sick contacts and thinks this might be his allergies flaring up. He has been taking Benadryl twice a day but ran out of his Flonase which he usually uses for this.  Relevant past medical, surgical, family and social history reviewed and updated as indicated. Interim medical history since our last visit reviewed. Allergies and medications reviewed and updated.  Review of Systems  Constitutional: Negative for fever and chills.  HENT: Positive for congestion, postnasal drip, rhinorrhea, sinus pressure, sneezing and sore throat. Negative for ear discharge, ear pain and voice change.   Eyes: Negative for pain, discharge, redness and visual disturbance.  Respiratory: Positive for cough. Negative for chest tightness, shortness of breath and wheezing.   Cardiovascular: Negative for chest pain and leg swelling.  Gastrointestinal: Negative for abdominal pain, diarrhea and constipation.  Genitourinary: Negative for difficulty urinating.  Musculoskeletal: Negative for back pain and gait problem.  Skin: Negative for rash.  Neurological: Negative for syncope, light-headedness and headaches.  All other systems reviewed and are negative.   Per HPI unless specifically indicated above     Medication List       This list is accurate as of: 09/25/15 11:35  AM.  Always use your most recent med list.               atorvastatin 40 MG tablet  Commonly known as:  LIPITOR  Take 1 tablet (40 mg total) by mouth daily. For cholesterol     fluticasone 50 MCG/ACT nasal spray  Commonly known as:  FLONASE  One to 2 sprays each nostril at bedtime     lisinopril 30 MG tablet  Commonly known as:  PRINIVIL,ZESTRIL  Take 1 tablet (30 mg total) by mouth daily.     PARoxetine 20 MG tablet  Commonly known as:  PAXIL  Take 1 tablet (20 mg total) by mouth every morning.           Objective:    BP 136/85 mmHg  Pulse 59  Temp(Src) 98 F (36.7 C) (Oral)  Ht 6' (1.829 m)  Wt 252 lb 3.2 oz (114.397 kg)  BMI 34.20 kg/m2  Wt Readings from Last 3 Encounters:  09/25/15 252 lb 3.2 oz (114.397 kg)  06/25/15 259 lb (117.482 kg)  06/09/15 259 lb 12.8 oz (117.845 kg)    Physical Exam  Constitutional: He is oriented to person, place, and time. He appears well-developed and well-nourished. No distress.  HENT:  Right Ear: Tympanic membrane, external ear and ear canal normal.  Left Ear: Tympanic membrane, external ear and ear canal normal.  Nose: Mucosal edema and rhinorrhea present. No sinus tenderness. No epistaxis. Right sinus exhibits maxillary sinus tenderness. Right sinus exhibits no frontal sinus tenderness. Left  sinus exhibits maxillary sinus tenderness. Left sinus exhibits no frontal sinus tenderness.  Mouth/Throat: Uvula is midline and mucous membranes are normal. Posterior oropharyngeal edema and posterior oropharyngeal erythema present. No oropharyngeal exudate or tonsillar abscesses.  Eyes: Conjunctivae and EOM are normal. Pupils are equal, round, and reactive to light. Right eye exhibits no discharge. No scleral icterus.  Neck: Neck supple. No thyromegaly present.  Cardiovascular: Normal rate, regular rhythm, normal heart sounds and intact distal pulses.   No murmur heard. Pulmonary/Chest: Effort normal and breath sounds normal. No respiratory  distress. He has no wheezes. He has no rales.  Musculoskeletal: Normal range of motion. He exhibits no edema.  Lymphadenopathy:    He has no cervical adenopathy.  Neurological: He is alert and oriented to person, place, and time. Coordination normal.  Skin: Skin is warm and dry. No rash noted. He is not diaphoretic.  Psychiatric: He has a normal mood and affect. His behavior is normal.  Nursing note and vitals reviewed.      Assessment & Plan:       Problem List Items Addressed This Visit    None    Visit Diagnoses    Non-seasonal allergic rhinitis due to pollen        Has been on an allergy pill but ran out of his Flonase. Will give steroid shot to give him a boost and get him started    Relevant Medications    fluticasone (FLONASE) 50 MCG/ACT nasal spray    methylPREDNISolone acetate (DEPO-MEDROL) injection 80 mg (Start on 09/25/2015 11:45 AM)        Follow up plan: Return if symptoms worsen or fail to improve.  Counseling provided for all of the vaccine components No orders of the defined types were placed in this encounter.    Caryl Pina, MD [facility] 09/25/2015, 11:35 AM

## 2015-10-04 ENCOUNTER — Other Ambulatory Visit: Payer: Self-pay | Admitting: Family Medicine

## 2015-10-12 ENCOUNTER — Encounter: Payer: Self-pay | Admitting: Family

## 2015-10-12 ENCOUNTER — Ambulatory Visit (INDEPENDENT_AMBULATORY_CARE_PROVIDER_SITE_OTHER): Payer: Commercial Managed Care - HMO | Admitting: Family

## 2015-10-12 VITALS — BP 122/73 | HR 69 | Temp 98.0°F | Ht 72.0 in | Wt 249.0 lb

## 2015-10-12 DIAGNOSIS — R531 Weakness: Secondary | ICD-10-CM | POA: Diagnosis not present

## 2015-10-12 DIAGNOSIS — R252 Cramp and spasm: Secondary | ICD-10-CM

## 2015-10-12 DIAGNOSIS — E860 Dehydration: Secondary | ICD-10-CM | POA: Diagnosis not present

## 2015-10-12 NOTE — Patient Instructions (Signed)
Muscle Cramps and Spasms Muscle cramps and spasms occur when a muscle or muscles tighten and you have no control over this tightening (involuntary muscle contraction). They are a common problem and can develop in any muscle. The most common place is in the calf muscles of the leg. Both muscle cramps and muscle spasms are involuntary muscle contractions, but they also have differences:   Muscle cramps are sporadic and painful. They may last a few seconds to a quarter of an hour. Muscle cramps are often more forceful and last longer than muscle spasms.  Muscle spasms may or may not be painful. They may also last just a few seconds or much longer. CAUSES  It is uncommon for cramps or spasms to be due to a serious underlying problem. In many cases, the cause of cramps or spasms is unknown. Some common causes are:   Overexertion.   Overuse from repetitive motions (doing the same thing over and over).   Remaining in a certain position for a long period of time.   Improper preparation, form, or technique while performing a sport or activity.   Dehydration.   Injury.   Side effects of some medicines.   Abnormally low levels of the salts and ions in your blood (electrolytes), especially potassium and calcium. This could happen if you are taking water pills (diuretics) or you are pregnant.  Some underlying medical problems can make it more likely to develop cramps or spasms. These include, but are not limited to:   Diabetes.   Parkinson disease.   Hormone disorders, such as thyroid problems.   Alcohol abuse.   Diseases specific to muscles, joints, and bones.   Blood vessel disease where not enough blood is getting to the muscles.  HOME CARE INSTRUCTIONS   Stay well hydrated. Drink enough water and fluids to keep your urine clear or pale yellow.  It may be helpful to massage, stretch, and relax the affected muscle.  For tight or tense muscles, use a warm towel, heating  pad, or hot shower water directed to the affected area.  If you are sore or have pain after a cramp or spasm, applying ice to the affected area may relieve discomfort.  Put ice in a plastic bag.  Place a towel between your skin and the bag.  Leave the ice on for 15-20 minutes, 03-04 times a day.  Medicines used to treat a known cause of cramps or spasms may help reduce their frequency or severity. Only take over-the-counter or prescription medicines as directed by your caregiver. SEEK MEDICAL CARE IF:  Your cramps or spasms get more severe, more frequent, or do not improve over time.  MAKE SURE YOU:   Understand these instructions.  Will watch your condition.  Will get help right away if you are not doing well or get worse.   This information is not intended to replace advice given to you by your health care provider. Make sure you discuss any questions you have with your health care provider.   Document Released: 11/18/2001 Document Revised: 09/23/2012 Document Reviewed: 05/15/2012 Elsevier Interactive Patient Education 2016 [employer] Dehydration, Adult Dehydration is a condition in which you do not have enough fluid or water in your body. It happens when you take in less fluid than you lose. Vital organs such as the kidneys, brain, and heart cannot function without a proper amount of fluids. Any loss of fluids from the body can cause dehydration.  Dehydration can range from mild to severe.  This condition should be treated right away to help prevent it from becoming severe. CAUSES  This condition may be caused by:  Vomiting.  Diarrhea.  Excessive sweating, such as when exercising in hot or humid weather.  Not drinking enough fluid during strenuous exercise or during an illness.  Excessive urine output.  Fever.  Certain medicines. RISK FACTORS This condition is more likely to develop in:  People who are taking certain medicines that cause the body to lose excess  fluid (diuretics).   People who have a chronic illness, such as diabetes, that may increase urination.  Older adults.   People who live at high altitudes.   People who participate in endurance sports.  SYMPTOMS  Mild Dehydration  Thirst.  Dry lips.  Slightly dry mouth.  Dry, warm skin. Moderate Dehydration  Very dry mouth.   Muscle cramps.   Dark urine and decreased urine production.   Decreased tear production.   Headache.   Light-headedness, especially when you stand up from a sitting position.  Severe Dehydration  Changes in skin.   Cold and clammy skin.   Skin does not spring back quickly when lightly pinched and released.   Changes in body fluids.   Extreme thirst.   No tears.   Not able to sweat when body temperature is high, such as in hot weather.   Minimal urine production.   Changes in vital signs.   Rapid, weak pulse (more than 100 beats per minute when you are sitting still).   Rapid breathing.   Low blood pressure.   Other changes.   Sunken eyes.   Cold hands and feet.   Confusion.  Lethargy and difficulty being awakened.  Fainting (syncope).   Short-term weight loss.   Unconsciousness. DIAGNOSIS  This condition may be diagnosed based on your symptoms. You may also have tests to determine how severe your dehydration is. These tests may include:   Urine tests.   Blood tests.  TREATMENT  Treatment for this condition depends on the severity. Mild or moderate dehydration can often be treated at home. Treatment should be started right away. Do not wait until dehydration becomes severe. Severe dehydration needs to be treated at the hospital. Treatment for Mild Dehydration  Drinking plenty of water to replace the fluid you have lost.   Replacing minerals in your blood (electrolytes) that you may have lost.  Treatment for Moderate Dehydration  Consuming oral rehydration solution  (ORS). Treatment for Severe Dehydration  Receiving fluid through an IV tube.   Receiving electrolyte solution through a feeding tube that is passed through your nose and into your stomach (nasogastric tube or NG tube).  Correcting any abnormalities in electrolytes. HOME CARE INSTRUCTIONS   Drink enough fluid to keep your urine clear or pale yellow.   Drink water or fluid slowly by taking small sips. You can also try sucking on ice cubes.  Have food or beverages that contain electrolytes. Examples include bananas and sports drinks.  Take over-the-counter and prescription medicines only as told by your health care provider.   Prepare ORS according to the manufacturer's instructions. Take sips of ORS every 5 minutes until your urine returns to normal.  If you have vomiting or diarrhea, continue to try to drink water, ORS, or both.   If you have diarrhea, avoid:   Beverages that contain caffeine.   Fruit juice.   Milk.   Carbonated soft drinks.  Do not take salt tablets. This can lead to the condition  of having too much sodium in your body (hypernatremia).  SEEK MEDICAL CARE IF:  You cannot eat or drink without vomiting.  You have had moderate diarrhea during a period of more than 24 hours.  You have a fever. SEEK IMMEDIATE MEDICAL CARE IF:   You have extreme thirst.  You have severe diarrhea.  You have not urinated in 6-8 hours, or you have urinated only a small amount of very dark urine.  You have shriveled skin.  You are dizzy, confused, or both.   This information is not intended to replace advice given to you by your health care provider. Make sure you discuss any questions you have with your health care provider.   Document Released: 05/29/2005 Document Revised: 02/17/2015 Document Reviewed: 10/14/2014 Elsevier Interactive Patient Education [employer].

## 2015-10-12 NOTE — Progress Notes (Signed)
   Subjective:    Patient ID: Nicholas Hawkins, male    DOB: Nov 01, 1935, 80 y.o.   MRN: [id number]  HPI Pt presents to the office today with light-headed, cramps in hands and feet, and fatigue. 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 Pt states he tried eating some bananas, but did not seem to help. PT states the cramps are intermittent and are a 7 out 10.    Review of Systems  Constitutional: Negative.   HENT: Negative.   Respiratory: Negative.   Cardiovascular: Negative.   Gastrointestinal: Negative.   Endocrine: Negative.   Genitourinary: Negative.   Musculoskeletal: Negative.   Neurological: Negative.   Hematological: Negative.   Psychiatric/Behavioral: Negative.   All other systems reviewed and are negative.      Objective:   Physical Exam  Constitutional: He is oriented to person, place, and time. He appears well-developed and well-nourished. No distress.  HENT:  Head: Normocephalic.  Right Ear: External ear normal.  Left Ear: External ear normal.  Mouth/Throat: Oropharynx is clear and moist.  Eyes: Pupils are equal, round, and reactive to light. Right eye exhibits no discharge. Left eye exhibits no discharge.  Neck: Normal range of motion. Neck supple. No thyromegaly present.  Cardiovascular: Normal rate, regular rhythm, normal heart sounds and intact distal pulses.   No murmur heard. Pulmonary/Chest: Effort normal and breath sounds normal. No respiratory distress. He has no wheezes.  Abdominal: Soft. Bowel sounds are normal. He exhibits no distension. There is no tenderness.  Musculoskeletal: Normal range of motion. He exhibits no edema or tenderness.  Neurological: He is alert and oriented to person, place, and time. He has normal reflexes. No cranial nerve deficit.  Skin: Skin is warm and dry. No rash noted. No erythema.  Psychiatric: He has a normal mood and affect. His behavior is normal. Judgment and thought  content normal.  Vitals reviewed.     BP 122/73 mmHg  Pulse 69  Temp(Src) 98 F (36.7 C) (Oral)  Ht 6' (1.829 m)  Wt 249 lb (112.946 kg)  BMI 33.76 kg/m2     Assessment & Plan:  1. Dehydration -Rest -Force fluids -Pt to stay out of sun for next few day and "take it easy" -Encouraged patient while working in sun to take frequent breaks and force fluids-PT states that usually he does not drink anything while cutting down trees -RTO prn - CMP14+EGFR - Magnesium  2. Muscle cramp - CMP14+EGFR - Magnesium  3. Weakness - CMP14+EGFR - Magnesium  Evelina Dun, FNP

## 2015-10-13 LAB — CMP14+EGFR
ALBUMIN: 4.5 g/dL (ref 3.5–4.8)
ALK PHOS: 127 IU/L — AB (ref 39–117)
ALT: 21 IU/L (ref 0–44)
AST: 18 IU/L (ref 0–40)
Albumin/Globulin Ratio: 1.9 (ref 1.2–2.2)
BILIRUBIN TOTAL: 0.3 mg/dL (ref 0.0–1.2)
BUN / CREAT RATIO: 16 (ref 10–24)
BUN: 15 mg/dL (ref 8–27)
CO2: 24 mmol/L (ref 18–29)
Calcium: 9.1 mg/dL (ref 8.6–10.2)
Chloride: 97 mmol/L (ref 96–106)
Creatinine, Ser: 0.91 mg/dL (ref 0.76–1.27)
GFR calc Af Amer: 93 mL/min/{1.73_m2} (ref >59)
GFR calc non Af Amer: 80 mL/min/{1.73_m2} (ref >59)
GLUCOSE: 89 mg/dL (ref 65–99)
Globulin, Total: 2.4 g/dL (ref 1.5–4.5)
Potassium: 4.6 mmol/L (ref 3.5–5.2)
SODIUM: 139 mmol/L (ref 134–144)
Total Protein: 6.9 g/dL (ref 6.0–8.5)

## 2015-10-13 LAB — MAGNESIUM: MAGNESIUM: 2 mg/dL (ref 1.6–2.3)

## 2015-10-15 NOTE — Progress Notes (Signed)
Patient aware.

## 2015-12-08 ENCOUNTER — Encounter: Payer: Self-pay | Admitting: Physician Assistant

## 2015-12-08 ENCOUNTER — Ambulatory Visit (INDEPENDENT_AMBULATORY_CARE_PROVIDER_SITE_OTHER): Payer: Commercial Managed Care - HMO | Admitting: Physician Assistant

## 2015-12-08 VITALS — BP 113/69 | HR 89 | Temp 97.6°F | Ht 72.0 in | Wt 249.0 lb

## 2015-12-08 DIAGNOSIS — H6983 Other specified disorders of Eustachian tube, bilateral: Secondary | ICD-10-CM | POA: Diagnosis not present

## 2015-12-08 NOTE — Patient Instructions (Signed)

## 2015-12-08 NOTE — Progress Notes (Signed)
Subjective:     Patient ID: Nicholas Hawkins, male   DOB: [birth year], 80 y.o.   MRN: [id number]  HPI Pt with fatigue for 2 weeks and bilateral ear fullness He has had recent tick bite  Review of Systems  Constitutional: Positive for fatigue. Negative for activity change and appetite change.  HENT: Positive for congestion and postnasal drip. Negative for ear discharge, ear pain, nosebleeds, rhinorrhea, sinus pressure, sneezing and sore throat.   Respiratory: Negative.   Cardiovascular: Negative.        Objective:   Physical Exam  Constitutional: He appears well-developed and well-nourished.  HENT:  Right Ear: External ear normal.  Left Ear: External ear normal.  Mouth/Throat: Oropharynx is clear and moist. No oropharyngeal exudate.  TM's with fluid but landmarks remain  Neck: Neck supple.  Cardiovascular: Normal rate, regular rhythm and normal heart sounds.   Pulmonary/Chest: Effort normal and breath sounds normal. He has no wheezes.  Abdominal:  Bite to the umbilicus with no surrounding erythema, edema, or induration  Lymphadenopathy:    He has no cervical adenopathy.  Nursing note and vitals reviewed.      Assessment:     1. Eustachian tube dysfunction, bilateral   2. Tick Bite     Plan:     Restart his Flonase that he has not been taking OTC anithist Fluids Rest F/u if sx continue

## 2015-12-16 ENCOUNTER — Other Ambulatory Visit: Payer: Self-pay | Admitting: Family Medicine

## 2016-01-04 ENCOUNTER — Ambulatory Visit (INDEPENDENT_AMBULATORY_CARE_PROVIDER_SITE_OTHER): Payer: Commercial Managed Care - HMO | Admitting: Family Medicine

## 2016-01-04 ENCOUNTER — Encounter: Payer: Self-pay | Admitting: Family Medicine

## 2016-01-04 VITALS — BP 116/67 | HR 68 | Temp 97.1°F | Ht 73.83 in | Wt 244.2 lb

## 2016-01-04 DIAGNOSIS — I10 Essential (primary) hypertension: Secondary | ICD-10-CM | POA: Diagnosis not present

## 2016-01-04 DIAGNOSIS — R42 Dizziness and giddiness: Secondary | ICD-10-CM | POA: Diagnosis not present

## 2016-01-04 DIAGNOSIS — R0981 Nasal congestion: Secondary | ICD-10-CM | POA: Diagnosis not present

## 2016-01-04 MED ORDER — LISINOPRIL 10 MG PO TABS: 10.0000 mg | ORAL_TABLET | Freq: Every day | ORAL | 3 refills | Status: DC

## 2016-01-04 MED ORDER — METHYLPREDNISOLONE ACETATE 80 MG/ML IJ SUSP
80.0000 mg | Freq: Once | INTRAMUSCULAR | Status: AC
  Administered 2016-01-04: 80 mg via INTRAMUSCULAR

## 2016-01-04 NOTE — Progress Notes (Signed)
   HPI  Patient presents today here with dizziness, congestion, and ear popping.  Patient explains that he's had dizziness off and on for the last 3 months. He states that happens most often in the middle of the day when he is outside in the sun. He describes unsteadiness type feeling without room spinning sensation. He denies any severe headache, double vision, or focal weakness. He does have overall fatigue and weakness that lasts throughout the day with improvement in the evening.  He describes bilateral ear popping and nasal congestion for the same 3 months, he states that he started using Flonase which is not helping. He does not take a daily antihistamine.  PMH: Smoking status noted ROS: Per HPI  Objective: BP 116/67   Pulse 68   Temp 97.1 F (36.2 C) (Oral)   Ht 6' 1.83" (1.875 m)   Wt 244 lb 3.2 oz (110.8 kg)   BMI 31.50 kg/m  Gen: NAD, alert, cooperative with exam HEENT: NCAT, PERRLA, EOMI, no facial tenderness over the maxillary or frontal sinuses, no mastoid tenderness Nares with erythema and mild turbinate swelling Cobblestoning of the oropharynx CV: RRR, good S1/S2, no murmur Resp: CTABL, no wheezes, non-labored Ext: No edema, warm Neuro: Alert and oriented, strength 5/5 and sensation intact in all 4 extremities, cranial nerves II through XII intact  Assessment and plan:  # Dizziness Vague unsteadiness type feeling that happens mostly when he is outside in the sunlight. Along with his malaise that improves her exam of the day I believe this most likely represents transient relative hypotension. Likely exacerbated by dehydration from working outside. I will back off lisinopril to 10 mg and follow-up in 3-4 weeks to see if he seeing any improvement.  # Nasal congestion Signs of postnasal drip on his exam with cobblestoning No improvement with Flonase, patient is very irritated with the symptoms and would like relief. He has had a tick bite about 2 weeks ago,  however has no arthralgias, fevers, or rashes. He has no sinus tenderness on exam. No indication for antibiotic treatment I have given him a shot of IM Depo-Medrol in clinic today Also recommended starting Allegra daily.  Follow-up in 3-4 weeks, considering the constellation of symptoms and his history of lacunar infarct I would recommend CT of the head to rule out additional ischemic events should his dizziness not improve.  Hypertension Very well controlled on 30 mg of lisinopril, de-escalating to see if he has improvement in dizziness.     Meds ordered this encounter  Medications  . lisinopril (PRINIVIL,ZESTRIL) 10 MG tablet    Sig: Take 1 tablet (10 mg total) by mouth daily.    Dispense:  30 tablet    Refill:  3  . methylPREDNISolone acetate (DEPO-MEDROL) injection 80 mg    Laroy Apple, MD [facility] 01/04/2016, 11:35 AM

## 2016-01-04 NOTE — Patient Instructions (Addendum)
Great to see you!  I have changed your blood pressure medicine, Stop taking lisinopril 30 mg and start taking lisinopril 10 mg  Start allegra, 1 pill once a day for congestion, this is over the counter  Lets see you back in 3-4 weeks to make sure you are getting better.

## 2016-01-06 ENCOUNTER — Other Ambulatory Visit: Payer: Self-pay | Admitting: Family Medicine

## 2016-01-27 ENCOUNTER — Ambulatory Visit (INDEPENDENT_AMBULATORY_CARE_PROVIDER_SITE_OTHER): Payer: Commercial Managed Care - HMO | Admitting: Family Medicine

## 2016-01-27 ENCOUNTER — Encounter: Payer: Self-pay | Admitting: Family Medicine

## 2016-01-27 VITALS — BP 121/79 | HR 73 | Temp 97.3°F | Ht 73.83 in | Wt 239.4 lb

## 2016-01-27 DIAGNOSIS — J0101 Acute recurrent maxillary sinusitis: Secondary | ICD-10-CM | POA: Diagnosis not present

## 2016-01-27 DIAGNOSIS — R42 Dizziness and giddiness: Secondary | ICD-10-CM

## 2016-01-27 DIAGNOSIS — J320 Chronic maxillary sinusitis: Secondary | ICD-10-CM | POA: Diagnosis not present

## 2016-01-27 DIAGNOSIS — I10 Essential (primary) hypertension: Secondary | ICD-10-CM

## 2016-01-27 MED ORDER — AMOXICILLIN-POT CLAVULANATE 875-125 MG PO TABS: 1.0000 | ORAL_TABLET | Freq: Two times a day (BID) | ORAL | 0 refills | Status: DC

## 2016-01-27 NOTE — Patient Instructions (Signed)
Great to see you!  Be sure to finish all of the antibiotics Eat yogurt once a day while you are on the medicines.   Consider using flonase 2 sprays per nostril once a day

## 2016-01-27 NOTE — Progress Notes (Signed)
   HPI  Patient presents today here for follow-up hypertension and dizziness. Also complaint of sinusitis.  Sinusitis Patient states that he's had persistent recurrent sinusitis for the last 2-3 years. He states that whenever he takes antibiotics he has transient relief for 1-3 weeks and then return of symptoms. He states that symptoms are usually worse on the right than the left.  Over the last one week she's had left-sided and less severe right-sided maxillary sinus tenderness and fullness. He also has congestion. He took amoxicillin for this problem about 2 months ago with improvement for about 2 weeks.  Hypertension Good medication compliance, has reduced dose States that his dizziness when first standing up has improved, however dizziness with heavy exercise in the heat has not improved, he states that when he exercises the same amount indoors he has no problem.  PMH: Smoking status noted ROS: Per HPI  Objective: BP 121/79 (BP Location: Right Arm, Patient Position: Sitting, Cuff Size: Large)   Pulse 73   Temp 97.3 F (36.3 C) (Oral)   Ht 6' 1.83" (1.875 m)   Wt 239 lb 6.4 oz (108.6 kg)   BMI 30.88 kg/m  Gen: NAD, alert, cooperative with exam HEENT: NCAT, nares with swollen nasal mucosa, turbinates appear normal, TMs normal bilaterally, bilateral tenderness to palpation of the maxillary sinuses, left greater than right CV: RRR, good S1/S2, no murmur Resp: CTABL, no wheezes, non-labored Ext: No edema, warm Neuro: Alert and oriented, No gross deficits  Assessment and plan:  # Acute on chronic maxillary sinusitis Treat acute sinusitis with Augmentin Recommended daily probiotic or yogurt. With chronic intermittent sinusitis over the last 2 years I have referred him to ENT. Recommend continue Flonase  # Hypertension Well-controlled on current dose of lisinopril Continue, lisinopril was decreased last visit  # Dizziness Multifactorial A believe his symptoms when he  goes outside and works represent a transient dehydration and hypotension as a result. He has had improvement of dizziness when first standing, this is likely orthostasis, I'm glad this has improved. He seems to have normal exercise tolerance indoors.    Orders Placed This Encounter  Procedures  . Ambulatory referral to ENT    Referral Priority:   Routine    Referral Type:   Consultation    Referral Reason:   Specialty Services Required    Requested Specialty:   Otolaryngology    Number of Visits Requested:   1    Meds ordered this encounter  Medications  . amoxicillin-clavulanate (AUGMENTIN) 875-125 MG tablet    Sig: Take 1 tablet by mouth 2 (two) times daily.    Dispense:  20 tablet    Refill:  0    Laroy Apple, MD [facility] 01/27/2016, 8:05 AM

## 2016-02-24 ENCOUNTER — Ambulatory Visit (INDEPENDENT_AMBULATORY_CARE_PROVIDER_SITE_OTHER): Payer: Medicare PPO | Admitting: Otolaryngology

## 2016-03-22 ENCOUNTER — Ambulatory Visit (INDEPENDENT_AMBULATORY_CARE_PROVIDER_SITE_OTHER): Payer: Commercial Managed Care - HMO | Admitting: Family Medicine

## 2016-03-22 ENCOUNTER — Encounter: Payer: Self-pay | Admitting: Family Medicine

## 2016-03-22 VITALS — BP 138/79 | HR 69 | Temp 97.9°F | Ht 73.83 in | Wt 249.4 lb

## 2016-03-22 DIAGNOSIS — R7989 Other specified abnormal findings of blood chemistry: Secondary | ICD-10-CM

## 2016-03-22 DIAGNOSIS — I10 Essential (primary) hypertension: Secondary | ICD-10-CM | POA: Diagnosis not present

## 2016-03-22 DIAGNOSIS — E349 Endocrine disorder, unspecified: Secondary | ICD-10-CM | POA: Diagnosis not present

## 2016-03-22 DIAGNOSIS — E782 Mixed hyperlipidemia: Secondary | ICD-10-CM | POA: Diagnosis not present

## 2016-03-22 DIAGNOSIS — E6609 Other obesity due to excess calories: Secondary | ICD-10-CM | POA: Diagnosis not present

## 2016-03-22 DIAGNOSIS — Z23 Encounter for immunization: Secondary | ICD-10-CM

## 2016-03-22 DIAGNOSIS — Z6832 Body mass index (BMI) 32.0-32.9, adult: Secondary | ICD-10-CM | POA: Diagnosis not present

## 2016-03-22 DIAGNOSIS — R0609 Other forms of dyspnea: Secondary | ICD-10-CM

## 2016-03-22 DIAGNOSIS — R5383 Other fatigue: Secondary | ICD-10-CM | POA: Diagnosis not present

## 2016-03-22 MED ORDER — LISINOPRIL 10 MG PO TABS: 10.0000 mg | ORAL_TABLET | Freq: Every day | ORAL | 6 refills | Status: DC

## 2016-03-22 MED ORDER — PAROXETINE HCL 20 MG PO TABS: 20.0000 mg | ORAL_TABLET | Freq: Every morning | ORAL | 6 refills | Status: DC

## 2016-03-22 NOTE — Progress Notes (Signed)
Subjective:  Patient ID: Nicholas Hawkins, male    DOB: 11-Oct-1935  Age: 80 y.o. MRN: [id number]  CC: Leg Pain (pt here today c/o right leg pain x 2 weeks which he did while he was stepping up on a big piece of machinery. He states it is getting better but still bothering him.)   HPI Nicholas Hawkins presents for  follow-up of hypertension. Patient has no history of headache chest pain or shortness of breath or recent cough. Patient also denies symptoms of TIA such as numbness weakness lateralizing. Patient checks  blood pressure at home and has not had any elevated readings recently. Patient denies side effects from medication. States taking it regularly.  Patient does not desire treatment for the leg. It is getting better on its own. He is able to walk more now. He just wants to know what's causing it. The symptoms are as noted in the chief complaint. He walked with a limp for several days. But for the last 3-4 days he's noticed it being significantly better. He's done nothing in particular with it except to cut back on his ambulation secondary to pain. But he remains active.  Patient complains that he has no energy. 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 He does not have any chest pain shortness of breath or edema. He does not have any problems with heat or cold intolerance, constipation or diarrhea. He does not have any melena hematochezia or signs of rectal bleeding.   History Nicholas Hawkins has a past medical history of Hyperlipidemia; HYPERTENSION, MILD; Low testosterone; Obesity; and Obesity, unspecified.   He has a past surgical history that includes Appendectomy.   His family history includes Heart disease in his mother.He reports that he has never smoked. He has never used smokeless tobacco. He reports that he does not drink alcohol or use drugs.  No current outpatient prescriptions on file prior to visit.   No current facility-administered medications  on file prior to visit.     ROS Review of Systems  Constitutional: Positive for fatigue. Negative for chills, diaphoresis, fever and unexpected weight change. 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  HENT: Positive for hearing loss. Negative for congestion, rhinorrhea and sore throat.   Eyes: Negative for visual disturbance.  Respiratory: Negative for cough and shortness of breath.   Cardiovascular: Negative for chest pain.  Gastrointestinal: Negative for abdominal pain, constipation and diarrhea.  Genitourinary: Negative for dysuria and flank pain.  Musculoskeletal: Positive for arthralgias and myalgias. Negative for joint swelling.  Skin: Negative for rash.  Neurological: Negative for dizziness and headaches.  Psychiatric/Behavioral: Negative for dysphoric mood and sleep disturbance.    Objective:  BP 138/79   Pulse 69   Temp 97.9 F (36.6 C) (Oral)   Ht 6' 1.83" (1.875 m)   Wt 249 lb 6 oz (113.1 kg)   BMI 32.17 kg/m   BP Readings from Last 3 Encounters:  03/22/16 138/79  01/27/16 121/79  01/04/16 116/67    Wt Readings from Last 3 Encounters:  03/22/16 249 lb 6 oz (113.1 kg)  01/27/16 239 lb 6.4 oz (108.6 kg)  01/04/16 244 lb 3.2 oz (110.8 kg)     Physical Exam  Constitutional: He is oriented to person, place, and time. He appears well-developed and well-nourished. No distress.  HENT:  Head: Normocephalic and atraumatic.  Right Ear: External ear normal.  Left Ear: External ear normal.  Nose: Nose normal.  Mouth/Throat: Oropharynx is clear and moist.  Eyes: Conjunctivae and EOM are normal. Pupils are equal, round, and reactive to light.  Neck: Normal range of motion. Neck supple. No thyromegaly present.  Cardiovascular: Normal rate, regular rhythm and normal heart sounds.   No murmur heard. Pulmonary/Chest: Effort normal and breath sounds normal. No respiratory distress. He has no wheezes. He has no rales.    Abdominal: Soft. Bowel sounds are normal. He exhibits no distension. There is no tenderness.  Musculoskeletal: Normal range of motion. He exhibits tenderness (at insertion of semitendinosis on right).  Lymphadenopathy:    He has no cervical adenopathy.  Neurological: He is alert and oriented to person, place, and time. He has normal reflexes.  Skin: Skin is warm and dry.  Psychiatric: He has a normal mood and affect. His behavior is normal. Judgment and thought content normal.     Lab Results  Component Value Date   WBC 4.5 04/30/2015   HGB 15.6 04/30/2015   HCT 43.1 04/30/2015   PLT 179 04/30/2015   GLUCOSE 89 10/12/2015   CHOL 140 04/30/2015   TRIG 159 (H) 04/30/2015   HDL 24 (L) 04/30/2015   LDLCALC 84 04/30/2015   ALT 21 10/12/2015   AST 18 10/12/2015   NA 139 10/12/2015   K 4.6 10/12/2015   CL 97 10/12/2015   CREATININE 0.91 10/12/2015   BUN 15 10/12/2015   CO2 24 10/12/2015   TSH 1.590 02/19/2015   PSA 1.4 09/18/2014   INR 1.05 04/30/2015   HGBA1C 5.5% 03/06/2013       Assessment & Plan:   Nicholas Hawkins was seen today for leg pain.  Diagnoses and all orders for this visit:  Mixed hyperlipidemia -     Lipid panel  HYPERTENSION, MILD -     lisinopril (PRINIVIL,ZESTRIL) 10 MG tablet; Take 1 tablet (10 mg total) by mouth daily.  Low testosterone  Class 1 obesity due to excess calories without serious comorbidity with body mass index (BMI) of 32.0 to 32.9 in adult  DOE (dyspnea on exertion) -     TSH -     CBC with Differential/Platelet -     CMP14+EGFR -     Brain natriuretic peptide -     Testosterone,Free and Total  Other fatigue -     TSH -     CBC with Differential/Platelet -     CMP14+EGFR -     Brain natriuretic peptide -     Testosterone,Free and Total  Encounter for immunization -     Flu Vaccine QUAD 36+ mos IM  Other orders -     PARoxetine (PAXIL) 20 MG tablet; Take 1 tablet (20 mg total) by mouth every morning.   I have discontinued  Nicholas Hawkins fluticasone and amoxicillin-clavulanate. I have also changed his PARoxetine. Additionally, I am having him maintain his lisinopril.  Meds ordered this encounter  Medications  . lisinopril (PRINIVIL,ZESTRIL) 10 MG tablet    Sig: Take 1 tablet (10 mg total) by mouth daily.    Dispense:  30 tablet    Refill:  6  . PARoxetine (PAXIL) 20 MG tablet    Sig: Take 1 tablet (20 mg total) by mouth every morning.    Dispense:  30 tablet    Refill:  6      Follow-up: Return in about 6 months (around 09/20/2016).  Claretta Fraise, M.D.

## 2016-03-23 LAB — TSH: TSH: 2.47 u[IU]/mL (ref 0.450–4.500)

## 2016-03-23 LAB — CBC WITH DIFFERENTIAL/PLATELET
BASOS ABS: 0 10*3/uL (ref 0.0–0.2)
Basos: 0 %
EOS (ABSOLUTE): 0.1 10*3/uL (ref 0.0–0.4)
Eos: 1 %
Hematocrit: 45.2 % (ref 37.5–51.0)
Hemoglobin: 15.4 g/dL (ref 12.6–17.7)
IMMATURE GRANS (ABS): 0 10*3/uL (ref 0.0–0.1)
Immature Granulocytes: 0 %
LYMPHS: 27 %
Lymphocytes Absolute: 1.3 10*3/uL (ref 0.7–3.1)
MCH: 30.4 pg (ref 26.6–33.0)
MCHC: 34.1 g/dL (ref 31.5–35.7)
MCV: 89 fL (ref 79–97)
MONOS ABS: 0.5 10*3/uL (ref 0.1–0.9)
Monocytes: 9 %
NEUTROS ABS: 3.1 10*3/uL (ref 1.4–7.0)
NEUTROS PCT: 63 %
PLATELETS: 199 10*3/uL (ref 150–379)
RBC: 5.07 x10E6/uL (ref 4.14–5.80)
RDW: 12.8 % (ref 12.3–15.4)
WBC: 4.9 10*3/uL (ref 3.4–10.8)

## 2016-03-23 LAB — CMP14+EGFR
A/G RATIO: 1.7 (ref 1.2–2.2)
ALK PHOS: 114 IU/L (ref 39–117)
ALT: 21 IU/L (ref 0–44)
AST: 22 IU/L (ref 0–40)
Albumin: 4.4 g/dL (ref 3.5–4.8)
BILIRUBIN TOTAL: 0.4 mg/dL (ref 0.0–1.2)
BUN/Creatinine Ratio: 22 (ref 10–24)
BUN: 19 mg/dL (ref 8–27)
CHLORIDE: 99 mmol/L (ref 96–106)
CO2: 26 mmol/L (ref 18–29)
Calcium: 9.4 mg/dL (ref 8.6–10.2)
Creatinine, Ser: 0.87 mg/dL (ref 0.76–1.27)
GFR calc Af Amer: 96 mL/min/{1.73_m2} (ref >59)
GFR calc non Af Amer: 83 mL/min/{1.73_m2} (ref >59)
GLUCOSE: 105 mg/dL — AB (ref 65–99)
Globulin, Total: 2.6 g/dL (ref 1.5–4.5)
POTASSIUM: 4.5 mmol/L (ref 3.5–5.2)
Sodium: 140 mmol/L (ref 134–144)
TOTAL PROTEIN: 7 g/dL (ref 6.0–8.5)

## 2016-03-23 LAB — BRAIN NATRIURETIC PEPTIDE: BNP: 39.5 pg/mL (ref 0.0–100.0)

## 2016-03-23 LAB — LIPID PANEL
CHOLESTEROL TOTAL: 162 mg/dL (ref 100–199)
Chol/HDL Ratio: 6.2 ratio units — ABNORMAL HIGH (ref 0.0–5.0)
HDL: 26 mg/dL — ABNORMAL LOW (ref >39)
LDL Calculated: 88 mg/dL (ref 0–99)
Triglycerides: 241 mg/dL — ABNORMAL HIGH (ref 0–149)
VLDL Cholesterol Cal: 48 mg/dL — ABNORMAL HIGH (ref 5–40)

## 2016-03-23 LAB — TESTOSTERONE,FREE AND TOTAL
TESTOSTERONE FREE: 4.6 pg/mL — AB (ref 6.6–18.1)
TESTOSTERONE: 116 ng/dL — AB (ref 264–916)

## 2016-05-05 ENCOUNTER — Encounter: Payer: Self-pay | Admitting: Family Medicine

## 2016-05-05 ENCOUNTER — Ambulatory Visit (INDEPENDENT_AMBULATORY_CARE_PROVIDER_SITE_OTHER): Payer: Commercial Managed Care - HMO | Admitting: Family Medicine

## 2016-05-05 VITALS — BP 139/86 | HR 74 | Temp 98.3°F | Ht 73.5 in | Wt 255.0 lb

## 2016-05-05 DIAGNOSIS — T148XXA Other injury of unspecified body region, initial encounter: Secondary | ICD-10-CM

## 2016-05-05 DIAGNOSIS — M79661 Pain in right lower leg: Secondary | ICD-10-CM | POA: Diagnosis not present

## 2016-05-05 DIAGNOSIS — S86811A Strain of other muscle(s) and tendon(s) at lower leg level, right leg, initial encounter: Secondary | ICD-10-CM | POA: Diagnosis not present

## 2016-05-05 MED ORDER — PREDNISONE 20 MG PO TABS: ORAL_TABLET | ORAL | 0 refills | Status: DC

## 2016-05-05 NOTE — Progress Notes (Signed)
BP 139/86   Pulse 74   Temp 98.3 F (36.8 C) (Oral)   Ht 6' 1.5" (1.867 m)   Wt 255 lb (115.7 kg)   BMI 33.19 kg/m    Subjective:    Patient ID: Nicholas Hawkins, male    DOB: 07-05-1935, 80 y.o.   MRN: [id number]  HPI: Nicholas Hawkins is a 80 y.o. male presenting on 05/05/2016 for right leg pain   HPI Right leg pain Patient has a right lateral lower leg pain just distal to the knee but sometimes shoots down lower into the lateral aspect of his right leg. He says is going on about a month and initially started when he twisted it and knees had the pain since then. He says he has especially more pain when he is going down hills or down stairs. He has not really been using anything for it except trying to rest it he denies any fevers or chills or overlying redness or warmth. He denies any pain in the actual knee itself..  Relevant past medical, surgical, family and social history reviewed and updated as indicated. Interim medical history since our last visit reviewed. Allergies and medications reviewed and updated.  Review of Systems  Constitutional: Negative for chills and fever.  Eyes: Negative for discharge.  Respiratory: Negative for shortness of breath and wheezing.   Cardiovascular: Negative for chest pain and leg swelling.  Musculoskeletal: Positive for myalgias. Negative for arthralgias, back pain and gait problem.  Skin: Negative for rash.  All other systems reviewed and are negative.   Per HPI unless specifically indicated above     Medication List       Accurate as of 05/05/16  3:33 PM. Always use your most recent med list.          lisinopril 10 MG tablet Commonly known as:  PRINIVIL,ZESTRIL Take 1 tablet (10 mg total) by mouth daily.   PARoxetine 20 MG tablet Commonly known as:  PAXIL Take 1 tablet (20 mg total) by mouth every morning.   predniSONE 20 MG tablet Commonly known as:  DELTASONE 2 po at same time daily for 5 days          Objective:      BP 139/86   Pulse 74   Temp 98.3 F (36.8 C) (Oral)   Ht 6' 1.5" (1.867 m)   Wt 255 lb (115.7 kg)   BMI 33.19 kg/m   Wt Readings from Last 3 Encounters:  05/05/16 255 lb (115.7 kg)  03/22/16 249 lb 6 oz (113.1 kg)  01/27/16 239 lb 6.4 oz (108.6 kg)    Physical Exam  Constitutional: He is oriented to person, place, and time. He appears well-developed and well-nourished. No distress.  Eyes: Conjunctivae are normal. Right eye exhibits no discharge. Left eye exhibits no discharge. No scleral icterus.  Cardiovascular: Normal rate, regular rhythm, normal heart sounds and intact distal pulses.   No murmur heard. Pulmonary/Chest: Effort normal and breath sounds normal. No respiratory distress. He has no wheezes. He has no rales.  Musculoskeletal: Normal range of motion. He exhibits no edema.       Right knee: He exhibits normal range of motion, no swelling, no effusion and no deformity. No tenderness found.       Right lower leg: He exhibits tenderness. He exhibits no swelling and no deformity.       Legs: Neurological: He is alert and oriented to person, place, and time. Coordination normal.  Skin: Skin  is warm and dry. No rash noted. He is not diaphoretic. No erythema.  Psychiatric: He has a normal mood and affect. His behavior is normal.  Nursing note and vitals reviewed.     Assessment & Plan:   Problem List Items Addressed This Visit    None    Visit Diagnoses    Muscle strain    -  Primary   Right lower leg, will try short course of prednisone and see if improves, if does not improve we'll consider imaging or orthopedic referral in future   Relevant Medications   predniSONE (DELTASONE) 20 MG tablet       Follow up plan: Return if symptoms worsen or fail to improve.  Counseling provided for all of the vaccine components No orders of the defined types were placed in this encounter.   Caryl Pina, MD [facility] 05/05/2016, 3:33  PM

## 2016-05-17 ENCOUNTER — Ambulatory Visit (INDEPENDENT_AMBULATORY_CARE_PROVIDER_SITE_OTHER): Payer: Commercial Managed Care - HMO | Admitting: Family Medicine

## 2016-05-17 ENCOUNTER — Encounter: Payer: Self-pay | Admitting: Family Medicine

## 2016-05-17 VITALS — BP 135/84 | HR 76 | Temp 98.0°F | Ht 73.5 in | Wt 249.0 lb

## 2016-05-17 DIAGNOSIS — T148XXA Other injury of unspecified body region, initial encounter: Secondary | ICD-10-CM

## 2016-05-17 DIAGNOSIS — R0981 Nasal congestion: Secondary | ICD-10-CM

## 2016-05-17 MED ORDER — FLUTICASONE PROPIONATE 50 MCG/ACT NA SUSP: 1.0000 | Freq: Two times a day (BID) | NASAL | 6 refills | Status: DC | PRN

## 2016-05-17 MED ORDER — PREDNISONE 20 MG PO TABS: ORAL_TABLET | ORAL | 0 refills | Status: DC

## 2016-05-17 NOTE — Progress Notes (Signed)
BP 135/84   Pulse 76   Temp 98 F (36.7 C) (Oral)   Ht 6' 1.5" (1.867 m)   Wt 249 lb (112.9 kg)   BMI 32.41 kg/m    Subjective:    Patient ID: Nicholas Hawkins, male    DOB: 03/07/1936, 80 y.o.   MRN: [id number]  HPI: Nicholas Hawkins is a 80 y.o. male presenting on 05/17/2016 for Followup right leg pain (patient reports leg is a lot better but he would like for you to extend the prescription for a little longer if possible)   HPI Right leg pain Patient is coming in for follow-up on right leg pain and muscle strain. He had a course of prednisone to help with this and it did help greatly. He still has some of the left and would like to try another few days to see if they can help get rid of it completely. He denies any fevers or chills or numbness or weakness in that leg. He is ambulating a lot better.  Sinus congestion and allergies Patient is coming in for sinus congestion and allergies that has been causing him issues over the past few days. He denies any fevers or chills or shortness of breath or wheezing. He has been using Flonase and Mucinex.  Relevant past medical, surgical, family and social history reviewed and updated as indicated. Interim medical history since our last visit reviewed. Allergies and medications reviewed and updated.  Review of Systems  Constitutional: Negative for chills and fever.  HENT: Positive for congestion, postnasal drip, rhinorrhea, sinus pressure, sneezing and sore throat. Negative for ear discharge, ear pain and voice change.   Eyes: Negative for pain, discharge, redness and visual disturbance.  Respiratory: Negative for shortness of breath and wheezing.   Cardiovascular: Negative for chest pain and leg swelling.  Musculoskeletal: Positive for myalgias. Negative for back pain and gait problem.  Skin: Negative for rash.  Neurological: Negative for dizziness.  All other systems reviewed and are negative.   Per HPI unless specifically indicated  above     Medication List       Accurate as of 05/17/16  5:04 PM. Always use your most recent med list.          fluticasone 50 MCG/ACT nasal spray Commonly known as:  FLONASE Place 1 spray into both nostrils 2 (two) times daily as needed for allergies or rhinitis.   lisinopril 10 MG tablet Commonly known as:  PRINIVIL,ZESTRIL Take 1 tablet (10 mg total) by mouth daily.   PARoxetine 20 MG tablet Commonly known as:  PAXIL Take 1 tablet (20 mg total) by mouth every morning.   predniSONE 20 MG tablet Commonly known as:  DELTASONE 2 po at same time daily for 5 days          Objective:    BP 135/84   Pulse 76   Temp 98 F (36.7 C) (Oral)   Ht 6' 1.5" (1.867 m)   Wt 249 lb (112.9 kg)   BMI 32.41 kg/m   Wt Readings from Last 3 Encounters:  05/17/16 249 lb (112.9 kg)  05/05/16 255 lb (115.7 kg)  03/22/16 249 lb 6 oz (113.1 kg)    Physical Exam  Constitutional: He is oriented to person, place, and time. He appears well-developed and well-nourished. No distress.  HENT:  Right Ear: Tympanic membrane, external ear and ear canal normal.  Left Ear: Tympanic membrane, external ear and ear canal normal.  Nose: Mucosal edema and  rhinorrhea present. No sinus tenderness. No epistaxis. Right sinus exhibits maxillary sinus tenderness. Right sinus exhibits no frontal sinus tenderness. Left sinus exhibits maxillary sinus tenderness. Left sinus exhibits no frontal sinus tenderness.  Mouth/Throat: Uvula is midline and mucous membranes are normal. Posterior oropharyngeal edema and posterior oropharyngeal erythema present. No oropharyngeal exudate or tonsillar abscesses.  Eyes: Conjunctivae and EOM are normal. Pupils are equal, round, and reactive to light. Right eye exhibits no discharge. Left eye exhibits no discharge. No scleral icterus.  Neck: Neck supple. No thyromegaly present.  Cardiovascular: Normal rate, regular rhythm, normal heart sounds and intact distal pulses.   No murmur  heard. Pulmonary/Chest: Effort normal and breath sounds normal. No respiratory distress. He has no wheezes. He has no rales.  Musculoskeletal: Normal range of motion. He exhibits no edema.       Right knee: He exhibits normal range of motion, no swelling, no effusion and no deformity. No tenderness found.       Right lower leg: He exhibits tenderness. He exhibits no swelling and no deformity.       Legs: Lymphadenopathy:    He has no cervical adenopathy.  Neurological: He is alert and oriented to person, place, and time. Coordination normal.  Skin: Skin is warm and dry. No rash noted. He is not diaphoretic. No erythema.  Psychiatric: He has a normal mood and affect. His behavior is normal.  Nursing note and vitals reviewed.     Assessment & Plan:   Problem List Items Addressed This Visit    None    Visit Diagnoses    Muscle strain    -  Primary   One more dose of prednisone, instruct patient to slowly work back into stretching and movement, pain will not go away completely for months   Relevant Medications   predniSONE (DELTASONE) 20 MG tablet   Sinus congestion       Relevant Medications   fluticasone (FLONASE) 50 MCG/ACT nasal spray       Follow up plan: Return if symptoms worsen or fail to improve.  Counseling provided for all of the vaccine components No orders of the defined types were placed in this encounter.   Caryl Pina, MD [facility] 05/17/2016, 5:04 PM

## 2016-05-29 ENCOUNTER — Ambulatory Visit: Payer: Commercial Managed Care - HMO | Admitting: Family Medicine

## 2016-06-20 DIAGNOSIS — M1711 Unilateral primary osteoarthritis, right knee: Secondary | ICD-10-CM | POA: Diagnosis not present

## 2016-06-20 DIAGNOSIS — M25561 Pain in right knee: Secondary | ICD-10-CM | POA: Diagnosis not present

## 2016-06-21 ENCOUNTER — Other Ambulatory Visit: Payer: Self-pay | Admitting: Family Medicine

## 2016-07-24 DIAGNOSIS — M2241 Chondromalacia patellae, right knee: Secondary | ICD-10-CM | POA: Diagnosis not present

## 2016-07-24 DIAGNOSIS — M25561 Pain in right knee: Secondary | ICD-10-CM | POA: Diagnosis not present

## 2016-07-24 DIAGNOSIS — M23351 Other meniscus derangements, posterior horn of lateral meniscus, right knee: Secondary | ICD-10-CM | POA: Diagnosis not present

## 2016-07-24 DIAGNOSIS — M23321 Other meniscus derangements, posterior horn of medial meniscus, right knee: Secondary | ICD-10-CM | POA: Diagnosis not present

## 2016-07-28 DIAGNOSIS — M1711 Unilateral primary osteoarthritis, right knee: Secondary | ICD-10-CM | POA: Diagnosis not present

## 2016-07-28 DIAGNOSIS — S83241D Other tear of medial meniscus, current injury, right knee, subsequent encounter: Secondary | ICD-10-CM | POA: Diagnosis not present

## 2016-08-07 DIAGNOSIS — M1711 Unilateral primary osteoarthritis, right knee: Secondary | ICD-10-CM | POA: Diagnosis not present

## 2016-08-07 DIAGNOSIS — S83241D Other tear of medial meniscus, current injury, right knee, subsequent encounter: Secondary | ICD-10-CM | POA: Diagnosis not present

## 2016-08-18 DIAGNOSIS — M2241 Chondromalacia patellae, right knee: Secondary | ICD-10-CM | POA: Diagnosis not present

## 2016-08-18 DIAGNOSIS — S83241A Other tear of medial meniscus, current injury, right knee, initial encounter: Secondary | ICD-10-CM | POA: Diagnosis not present

## 2016-08-18 DIAGNOSIS — Z79899 Other long term (current) drug therapy: Secondary | ICD-10-CM | POA: Diagnosis not present

## 2016-08-18 DIAGNOSIS — Z8249 Family history of ischemic heart disease and other diseases of the circulatory system: Secondary | ICD-10-CM | POA: Diagnosis not present

## 2016-08-18 DIAGNOSIS — I10 Essential (primary) hypertension: Secondary | ICD-10-CM | POA: Diagnosis not present

## 2016-08-18 DIAGNOSIS — E669 Obesity, unspecified: Secondary | ICD-10-CM | POA: Diagnosis not present

## 2016-08-18 DIAGNOSIS — X58XXXA Exposure to other specified factors, initial encounter: Secondary | ICD-10-CM | POA: Diagnosis not present

## 2016-08-18 DIAGNOSIS — Z6832 Body mass index (BMI) 32.0-32.9, adult: Secondary | ICD-10-CM | POA: Diagnosis not present

## 2016-08-18 DIAGNOSIS — S83241D Other tear of medial meniscus, current injury, right knee, subsequent encounter: Secondary | ICD-10-CM | POA: Diagnosis not present

## 2016-08-18 DIAGNOSIS — M1711 Unilateral primary osteoarthritis, right knee: Secondary | ICD-10-CM | POA: Diagnosis not present

## 2016-08-18 DIAGNOSIS — S83281A Other tear of lateral meniscus, current injury, right knee, initial encounter: Secondary | ICD-10-CM | POA: Diagnosis not present

## 2016-08-21 DIAGNOSIS — E669 Obesity, unspecified: Secondary | ICD-10-CM | POA: Diagnosis not present

## 2016-08-21 DIAGNOSIS — M1711 Unilateral primary osteoarthritis, right knee: Secondary | ICD-10-CM | POA: Diagnosis not present

## 2016-08-21 DIAGNOSIS — Z8249 Family history of ischemic heart disease and other diseases of the circulatory system: Secondary | ICD-10-CM | POA: Diagnosis not present

## 2016-08-21 DIAGNOSIS — Z79899 Other long term (current) drug therapy: Secondary | ICD-10-CM | POA: Diagnosis not present

## 2016-08-21 DIAGNOSIS — Z6832 Body mass index (BMI) 32.0-32.9, adult: Secondary | ICD-10-CM | POA: Diagnosis not present

## 2016-08-21 DIAGNOSIS — M2241 Chondromalacia patellae, right knee: Secondary | ICD-10-CM | POA: Diagnosis not present

## 2016-08-21 DIAGNOSIS — S83241A Other tear of medial meniscus, current injury, right knee, initial encounter: Secondary | ICD-10-CM | POA: Diagnosis not present

## 2016-08-21 DIAGNOSIS — I10 Essential (primary) hypertension: Secondary | ICD-10-CM | POA: Diagnosis not present

## 2016-08-21 DIAGNOSIS — S83281A Other tear of lateral meniscus, current injury, right knee, initial encounter: Secondary | ICD-10-CM | POA: Diagnosis not present

## 2016-08-23 ENCOUNTER — Encounter: Payer: Self-pay | Admitting: *Deleted

## 2016-09-19 ENCOUNTER — Encounter: Payer: Self-pay | Admitting: Family Medicine

## 2016-09-19 ENCOUNTER — Ambulatory Visit (INDEPENDENT_AMBULATORY_CARE_PROVIDER_SITE_OTHER): Payer: Medicare HMO | Admitting: Family Medicine

## 2016-09-19 VITALS — BP 145/79 | HR 64 | Temp 97.2°F | Ht 73.5 in | Wt 247.4 lb

## 2016-09-19 DIAGNOSIS — I10 Essential (primary) hypertension: Secondary | ICD-10-CM | POA: Diagnosis not present

## 2016-09-19 DIAGNOSIS — E782 Mixed hyperlipidemia: Secondary | ICD-10-CM

## 2016-09-19 DIAGNOSIS — J301 Allergic rhinitis due to pollen: Secondary | ICD-10-CM

## 2016-09-19 MED ORDER — METHYLPREDNISOLONE ACETATE 80 MG/ML IJ SUSP
80.0000 mg | Freq: Once | INTRAMUSCULAR | Status: AC
  Administered 2016-09-19: 80 mg via INTRAMUSCULAR

## 2016-09-19 MED ORDER — LISINOPRIL 10 MG PO TABS: 10.0000 mg | ORAL_TABLET | Freq: Every day | ORAL | 1 refills | Status: DC

## 2016-09-19 MED ORDER — FLUTICASONE PROPIONATE 50 MCG/ACT NA SUSP: 1.0000 | Freq: Two times a day (BID) | NASAL | 6 refills | Status: DC | PRN

## 2016-09-19 MED ORDER — PAROXETINE HCL 20 MG PO TABS: 20.0000 mg | ORAL_TABLET | Freq: Every morning | ORAL | 1 refills | Status: DC

## 2016-09-19 NOTE — Progress Notes (Signed)
   HPI  Patient presents today there for 6 month follow-up of ch Hypertension Good medication compliance, not checking blood pressure at home No chest pain, headache, leg swelling.  Hyperlipidemia No medications, he's happy to avoid labs today.  Allergies Patient has irritated itchy red eyes, ear fullness, and nasal congestion for about one week. Patient has chronic issues with rhinorrhea. No shortness of breath, fever, chills, sweats. Not taking any medications currently.   PMH: Smoking status noted ROS: Per HPI  Objective: BP (!) 145/79   Pulse 64   Temp 97.2 F (36.2 C) (Oral)   Ht 6' 1.5" (1.867 m)   Wt 247 lb 6.4 oz (112.2 kg)   BMI 32.20 kg/m  Gen: NAD, alert, cooperative with exam HEENT: NCAT, conjunctivitis bilaterally, TMs normal bilaterally, nares with swollen turbinates bilaterally and erythematous boggy mucosa, PERRLA CV: RRR, good S1/S2, no murmur Resp: CTABL, no wheezes, non-labored Ext: No edema, warm Neuro: Alert and oriented, No gross deficits  Assessment and plan:  # acute seasonal Treat with IM Depo-Medrol today Restart Zyrtec plus Flonase, Flonase prescription sent Could consider Singulair as well  # hyperlipidemia No labs today, previous LDL reasonable. Plan annual labs, repeat in about 6 months  # hypertension Reasonably well controlled, on low-dose ACE inhibitor    Meds ordered this encounter  Medications  . lisinopril (PRINIVIL,ZESTRIL) 10 MG tablet    Sig: Take 1 tablet (10 mg total) by mouth daily.    Dispense:  90 tablet    Refill:  1  . PARoxetine (PAXIL) 20 MG tablet    Sig: Take 1 tablet (20 mg total) by mouth every morning.    Dispense:  90 tablet    Refill:  1  . fluticasone (FLONASE) 50 MCG/ACT nasal spray    Sig: Place 1 spray into both nostrils 2 (two) times daily as needed for allergies or rhinitis.    Dispense:  16 g    Refill:  [place], MD [facility] 09/19/2016, 8:15  AM

## 2016-09-19 NOTE — Patient Instructions (Signed)
Great to see you!  Start 1 plain zyrtec ( generic is perfectly fine, do not take zyrtec D) and flonase today. You will likely need this for a few months to help manage allergies.   Come back in about 6 months, we will plan to do labs at the next visit.

## 2016-09-19 NOTE — Addendum Note (Signed)
Addended by: Karle Plumber on: 09/19/2016 08:19 AM   Modules accepted: Orders

## 2016-09-21 ENCOUNTER — Ambulatory Visit: Payer: Commercial Managed Care - HMO | Admitting: Family Medicine

## 2016-10-10 ENCOUNTER — Ambulatory Visit (INDEPENDENT_AMBULATORY_CARE_PROVIDER_SITE_OTHER): Payer: Medicare HMO | Admitting: Family

## 2016-10-10 ENCOUNTER — Encounter: Payer: Self-pay | Admitting: Family

## 2016-10-10 VITALS — BP 118/69 | HR 69 | Temp 97.8°F | Ht 73.5 in | Wt 243.4 lb

## 2016-10-10 DIAGNOSIS — J301 Allergic rhinitis due to pollen: Secondary | ICD-10-CM | POA: Diagnosis not present

## 2016-10-10 MED ORDER — CETIRIZINE HCL 5 MG PO CHEW: 5.0000 mg | CHEWABLE_TABLET | Freq: Every day | ORAL | 1 refills | Status: DC

## 2016-10-10 NOTE — Patient Instructions (Signed)
Allergic Rhinitis Allergic rhinitis is when the mucous membranes in the nose respond to allergens. Allergens are particles in the air that cause your body to have an allergic reaction. This causes you to release allergic antibodies. Through a chain of events, these eventually cause you to release histamine into the blood stream. Although meant to protect the body, it is this release of histamine that causes your discomfort, such as frequent sneezing, congestion, and an itchy, runny nose. What are the causes? Seasonal allergic rhinitis (hay fever) is caused by pollen allergens that may come from grasses, trees, and weeds. Year-round allergic rhinitis (perennial allergic rhinitis) is caused by allergens such as house dust mites, pet dander, and mold spores. What are the signs or symptoms?  Nasal stuffiness (congestion).  Itchy, runny nose with sneezing and tearing of the eyes. How is this diagnosed? Your health care provider can help you determine the allergen or allergens that trigger your symptoms. If you and your health care provider are unable to determine the allergen, skin or blood testing may be used. Your health care provider will diagnose your condition after taking your health history and performing a physical exam. Your health care provider may assess you for other related conditions, such as asthma, pink eye, or an ear infection. How is this treated? Allergic rhinitis does not have a cure, but it can be controlled by:  Medicines that block allergy symptoms. These may include allergy shots, nasal sprays, and oral antihistamines.  Avoiding the allergen. Hay fever may often be treated with antihistamines in pill or nasal spray forms. Antihistamines block the effects of histamine. There are over-the-counter medicines that may help with nasal congestion and swelling around the eyes. Check with your health care provider before taking or giving this medicine. If avoiding the allergen or the  medicine prescribed do not work, there are many new medicines your health care provider can prescribe. Stronger medicine may be used if initial measures are ineffective. Desensitizing injections can be used if medicine and avoidance does not work. Desensitization is when a patient is given ongoing shots until the body becomes less sensitive to the allergen. Make sure you follow up with your health care provider if problems continue. Follow these instructions at home: It is not possible to completely avoid allergens, but you can reduce your symptoms by taking steps to limit your exposure to them. It helps to know exactly what you are allergic to so that you can avoid your specific triggers. Contact a health care provider if:  You have a fever.  You develop a cough that does not stop easily (persistent).  You have shortness of breath.  You start wheezing.  Symptoms interfere with normal daily activities. This information is not intended to replace advice given to you by your health care provider. Make sure you discuss any questions you have with your health care provider. Document Released: 02/21/2001 Document Revised: 01/28/2016 Document Reviewed: 02/03/2013 Elsevier Interactive Patient Education  2017 [employer]  

## 2016-10-10 NOTE — Progress Notes (Signed)
   Subjective:    Patient ID: Nicholas Hawkins, male    DOB: Oct 15, 1935, 81 y.o.   MRN: [id number]  Otalgia   There is pain in the right ear. This is a new problem. The current episode started today. The problem has been unchanged. There has been no fever. The patient is experiencing no pain. Associated symptoms include rhinorrhea. Pertinent negatives include no coughing, ear discharge, headaches, hearing loss or sore throat. He has tried nothing for the symptoms. The treatment provided no relief.      Review of Systems  HENT: Positive for ear pain and rhinorrhea. Negative for ear discharge, hearing loss and sore throat.   Respiratory: Negative for cough.   Neurological: Negative for headaches.  All other systems reviewed and are negative.      Objective:   Physical Exam  Constitutional: He is oriented to person, place, and time. He appears well-developed and well-nourished. No distress.  HENT:  Head: Normocephalic.  Right Ear: External ear normal. No swelling or tenderness. Tympanic membrane is not erythematous.  Left Ear: External ear normal. No swelling or tenderness. Tympanic membrane is not erythematous.  Nose: Mucosal edema and rhinorrhea present.  Mouth/Throat: Posterior oropharyngeal erythema present.  Eyes: Pupils are equal, round, and reactive to light. Right eye exhibits no discharge. Left eye exhibits no discharge.  Neck: Normal range of motion. Neck supple. No thyromegaly present.  Cardiovascular: Normal rate, regular rhythm, normal heart sounds and intact distal pulses.   No murmur heard. Pulmonary/Chest: Effort normal and breath sounds normal. No respiratory distress. He has no wheezes.  Abdominal: Soft. Bowel sounds are normal. He exhibits no distension. There is no tenderness.  Musculoskeletal: Normal range of motion. He exhibits no edema or tenderness.  Neurological: He is alert and oriented to person, place, and time. He has normal reflexes. No cranial nerve deficit.   Skin: Skin is warm and dry. No rash noted. No erythema.  Psychiatric: He has a normal mood and affect. His behavior is normal. Judgment and thought content normal.  Vitals reviewed.     BP 118/69   Pulse 69   Temp 97.8 F (36.6 C) (Oral)   Ht 6' 1.5" (1.867 m)   Wt 243 lb 6.4 oz (110.4 kg)   BMI 31.68 kg/m      Assessment & Plan:  1. Allergic rhinitis due to pollen, unspecified seasonality - Take meds as prescribed - Use a cool mist humidifier  -Use saline nose sprays frequently -Saline irrigations of the nose can be very helpful if done frequently.  * 4X daily for 1 week*  * Use of a nettie pot can be helpful with this. Follow directions with this* -Force fluids -For any cough or congestion  Use plain Mucinex- regular strength or max strength is fine   * Children- consult with Pharmacist for dosing -For fever or aces or pains- take tylenol or ibuprofen appropriate for age and weight. - cetirizine (ZYRTEC CHILDRENS ALLERGY) 5 MG chewable tablet; Chew 1 tablet (5 mg total) by mouth daily.  Dispense: 90 tablet; Refill: [place], FNP
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
## 2016-12-11 ENCOUNTER — Ambulatory Visit: Payer: Medicare HMO

## 2016-12-18 DIAGNOSIS — S83241D Other tear of medial meniscus, current injury, right knee, subsequent encounter: Secondary | ICD-10-CM | POA: Diagnosis not present

## 2016-12-18 DIAGNOSIS — S39012A Strain of muscle, fascia and tendon of lower back, initial encounter: Secondary | ICD-10-CM | POA: Diagnosis not present

## 2016-12-18 DIAGNOSIS — M1711 Unilateral primary osteoarthritis, right knee: Secondary | ICD-10-CM | POA: Diagnosis not present

## 2017-01-09 ENCOUNTER — Other Ambulatory Visit: Payer: Self-pay | Admitting: Family Medicine

## 2017-01-09 ENCOUNTER — Other Ambulatory Visit: Payer: Self-pay | Admitting: Family

## 2017-01-09 DIAGNOSIS — J301 Allergic rhinitis due to pollen: Secondary | ICD-10-CM

## 2017-01-09 DIAGNOSIS — I10 Essential (primary) hypertension: Secondary | ICD-10-CM

## 2017-01-10 DIAGNOSIS — M546 Pain in thoracic spine: Secondary | ICD-10-CM | POA: Diagnosis not present

## 2017-01-10 DIAGNOSIS — M9903 Segmental and somatic dysfunction of lumbar region: Secondary | ICD-10-CM | POA: Diagnosis not present

## 2017-01-10 DIAGNOSIS — M4004 Postural kyphosis, thoracic region: Secondary | ICD-10-CM | POA: Diagnosis not present

## 2017-01-10 DIAGNOSIS — M9901 Segmental and somatic dysfunction of cervical region: Secondary | ICD-10-CM | POA: Diagnosis not present

## 2017-01-10 DIAGNOSIS — M47812 Spondylosis without myelopathy or radiculopathy, cervical region: Secondary | ICD-10-CM | POA: Diagnosis not present

## 2017-01-10 DIAGNOSIS — M9902 Segmental and somatic dysfunction of thoracic region: Secondary | ICD-10-CM | POA: Diagnosis not present

## 2017-01-12 DIAGNOSIS — M9903 Segmental and somatic dysfunction of lumbar region: Secondary | ICD-10-CM | POA: Diagnosis not present

## 2017-01-12 DIAGNOSIS — M9901 Segmental and somatic dysfunction of cervical region: Secondary | ICD-10-CM | POA: Diagnosis not present

## 2017-01-12 DIAGNOSIS — M4004 Postural kyphosis, thoracic region: Secondary | ICD-10-CM | POA: Diagnosis not present

## 2017-01-12 DIAGNOSIS — M9902 Segmental and somatic dysfunction of thoracic region: Secondary | ICD-10-CM | POA: Diagnosis not present

## 2017-01-12 DIAGNOSIS — M47812 Spondylosis without myelopathy or radiculopathy, cervical region: Secondary | ICD-10-CM | POA: Diagnosis not present

## 2017-01-12 DIAGNOSIS — M546 Pain in thoracic spine: Secondary | ICD-10-CM | POA: Diagnosis not present

## 2017-01-16 DIAGNOSIS — M47812 Spondylosis without myelopathy or radiculopathy, cervical region: Secondary | ICD-10-CM | POA: Diagnosis not present

## 2017-01-16 DIAGNOSIS — M9901 Segmental and somatic dysfunction of cervical region: Secondary | ICD-10-CM | POA: Diagnosis not present

## 2017-01-16 DIAGNOSIS — M9903 Segmental and somatic dysfunction of lumbar region: Secondary | ICD-10-CM | POA: Diagnosis not present

## 2017-01-16 DIAGNOSIS — M9902 Segmental and somatic dysfunction of thoracic region: Secondary | ICD-10-CM | POA: Diagnosis not present

## 2017-01-16 DIAGNOSIS — M546 Pain in thoracic spine: Secondary | ICD-10-CM | POA: Diagnosis not present

## 2017-01-16 DIAGNOSIS — M4004 Postural kyphosis, thoracic region: Secondary | ICD-10-CM | POA: Diagnosis not present

## 2017-01-22 DIAGNOSIS — M9902 Segmental and somatic dysfunction of thoracic region: Secondary | ICD-10-CM | POA: Diagnosis not present

## 2017-01-22 DIAGNOSIS — M546 Pain in thoracic spine: Secondary | ICD-10-CM | POA: Diagnosis not present

## 2017-01-22 DIAGNOSIS — M9903 Segmental and somatic dysfunction of lumbar region: Secondary | ICD-10-CM | POA: Diagnosis not present

## 2017-01-22 DIAGNOSIS — M47812 Spondylosis without myelopathy or radiculopathy, cervical region: Secondary | ICD-10-CM | POA: Diagnosis not present

## 2017-01-22 DIAGNOSIS — M9901 Segmental and somatic dysfunction of cervical region: Secondary | ICD-10-CM | POA: Diagnosis not present

## 2017-01-22 DIAGNOSIS — M4004 Postural kyphosis, thoracic region: Secondary | ICD-10-CM | POA: Diagnosis not present

## 2017-02-14 ENCOUNTER — Ambulatory Visit (INDEPENDENT_AMBULATORY_CARE_PROVIDER_SITE_OTHER): Payer: Medicare HMO | Admitting: Family

## 2017-02-14 ENCOUNTER — Encounter: Payer: Self-pay | Admitting: Family

## 2017-02-14 VITALS — BP 144/76 | HR 52 | Temp 97.1°F | Ht 73.5 in | Wt 244.8 lb

## 2017-02-14 DIAGNOSIS — J301 Allergic rhinitis due to pollen: Secondary | ICD-10-CM | POA: Diagnosis not present

## 2017-02-14 DIAGNOSIS — R51 Headache: Secondary | ICD-10-CM

## 2017-02-14 DIAGNOSIS — H9319 Tinnitus, unspecified ear: Secondary | ICD-10-CM

## 2017-02-14 DIAGNOSIS — R519 Headache, unspecified: Secondary | ICD-10-CM

## 2017-02-14 MED ORDER — FLUTICASONE PROPIONATE 50 MCG/ACT NA SUSP: 1.0000 | Freq: Two times a day (BID) | NASAL | 6 refills | Status: DC | PRN

## 2017-02-14 MED ORDER — CETIRIZINE HCL 5 MG PO TABS: ORAL_TABLET | ORAL | 2 refills | Status: DC

## 2017-02-14 NOTE — Patient Instructions (Signed)
Tinnitus Tinnitus refers to hearing a sound when there is no actual source for that sound. This is often described as ringing in the ears. However, people with this condition may hear a variety of noises. A person may hear the sound in one ear or in both ears. The sounds of tinnitus can be soft, loud, or somewhere in between. Tinnitus can last for a few seconds or can be constant for days. It may go away without treatment and come back at various times. When tinnitus is constant or happens often, it can lead to other problems, such as trouble sleeping and trouble concentrating. Almost everyone experiences tinnitus at some point. Tinnitus that is long-lasting (chronic) or comes back often is a problem that may require medical attention. What are the causes? The cause of tinnitus is often not known. In some cases, it can result from other problems or conditions, including:  Exposure to loud noises from machinery, music, or other sources.  Hearing loss.  Ear or sinus infections.  Earwax buildup.  A foreign object in the ear.  Use of certain medicines.  Use of alcohol and caffeine.  High blood pressure.  Heart diseases.  Anemia.  Allergies.  Meniere disease.  Thyroid problems.  Tumors.  An enlarged part of a weakened blood vessel (aneurysm).  What are the signs or symptoms? The main symptom of tinnitus is hearing a sound when there is no source for that sound. It may sound like:  Buzzing.  Roaring.  Ringing.  Blowing air, similar to the sound heard when you listen to a seashell.  Hissing.  Whistling.  Sizzling.  Humming.  Running water.  A sustained musical note.  How is this diagnosed? Tinnitus is diagnosed based on your symptoms. Your health care provider will do a physical exam. A comprehensive hearing exam (audiologic exam) will be done if your tinnitus:  Affects only one ear (unilateral).  Causes hearing difficulties.  Lasts 6 months or  longer.  You may also need to see a health care provider who specializes in hearing disorders (audiologist). You may be asked to complete a questionnaire to determine the severity of your tinnitus. Tests may be done to help determine the cause and to rule out other conditions. These can include:  Imaging studies of your head and brain, such as: ? A CT scan. ? An MRI.  An imaging study of your blood vessels (angiogram).  How is this treated? Treating an underlying medical condition can sometimes make tinnitus go away. If your tinnitus continues, other treatments may include:  Medicines, such as certain antidepressants or sleeping aids.  Sound generators to mask the tinnitus. These include: ? Tabletop sound machines that play relaxing sounds to help you fall asleep. ? Wearable devices that fit in your ear and play sounds or music. ? A small device that uses headphones to deliver a signal embedded in music (acoustic neural stimulation). In time, this may change the pathways of your brain and make you less sensitive to tinnitus. This device is used for very severe cases when no other treatment is working.  Therapy and counseling to help you manage the stress of living with tinnitus.  Using hearing aids or cochlear implants, if your tinnitus is related to hearing loss.  Follow these instructions at home:  When possible, avoid being in loud places and being exposed to loud sounds.  Wear hearing protection, such as earplugs, when you are exposed to loud noises.  Do not take stimulants, such as nicotine,   alcohol, or caffeine.  Practice techniques for reducing stress, such as meditation, yoga, or deep breathing.  Use a white noise machine, a humidifier, or other devices to mask the sound of tinnitus.  Sleep with your head slightly raised. This may reduce the impact of tinnitus.  Try to get plenty of rest each night. Contact a health care provider if:  You have tinnitus in just one  ear.  Your tinnitus continues for 3 weeks or longer without stopping.  Home care measures are not helping.  You have tinnitus after a head injury.  You have tinnitus along with any of the following: ? Dizziness. ? Loss of balance. ? Nausea and vomiting. This information is not intended to replace advice given to you by your health care provider. Make sure you discuss any questions you have with your health care provider. Document Released: 05/29/2005 Document Revised: 01/30/2016 Document Reviewed: 10/29/2013 Elsevier Interactive Patient Education  2018 [employer].  

## 2017-02-14 NOTE — Progress Notes (Signed)
   Subjective:    Patient ID: Nicholas Hawkins, male    DOB: [birth year], 81 y.o.   MRN: [id number]   Pt presents to the office with headache and tinnitus. PT states yesterday he noticed "ringing and roaring" in his ears yesterday. Pt states he took Antivert yesterday and felt better for a little while. Pt states he woke up today and feels worse.  Headache   This is a new problem. The pain is located in the frontal and temporal region. The pain quality is similar to prior headaches. The quality of the pain is described as dull. The pain is at a severity of 3/10. The pain is mild. Associated symptoms include dizziness, sinus pressure and tinnitus. Pertinent negatives include no coughing, ear pain, sore throat or vomiting. Treatments tried: antivert. The treatment provided mild relief.      Review of Systems  HENT: Positive for sinus pressure and tinnitus. Negative for ear pain and sore throat.   Respiratory: Negative for cough.   Gastrointestinal: Negative for vomiting.  Neurological: Positive for dizziness and headaches.  All other systems reviewed and are negative.      Objective:   Physical Exam  Constitutional: He is oriented to person, place, and time. He appears well-developed and well-nourished. No distress.  HENT:  Head: Normocephalic.  Right Ear: External ear normal.  Left Ear: External ear normal.  Nose: Mucosal edema and rhinorrhea present.  Mouth/Throat: Oropharynx is clear and moist.  Eyes: Pupils are equal, round, and reactive to light. Right eye exhibits no discharge. Left eye exhibits no discharge.  Neck: Normal range of motion. Neck supple. No thyromegaly present.  Cardiovascular: Normal rate, regular rhythm, normal heart sounds and intact distal pulses.   No murmur heard. Pulmonary/Chest: Effort normal and breath sounds normal. No respiratory distress. He has no wheezes.  Abdominal: Soft. Bowel sounds are normal. He exhibits no distension. There is no tenderness.    Musculoskeletal: Normal range of motion. He exhibits no edema or tenderness.  Neurological: He is alert and oriented to person, place, and time.  Skin: Skin is warm and dry. No rash noted. No erythema.  Psychiatric: He has a normal mood and affect. His behavior is normal. Judgment and thought content normal.  Vitals reviewed.     BP (!) 148/91   Pulse 66   Temp (!) 97.1 F (36.2 C) (Oral)   Ht 6' 1.5" (1.867 m)   Wt 244 lb 12.8 oz (111 kg)   BMI 31.86 kg/m      Assessment & Plan:  1. Tinnitus, unspecified laterality - Ambulatory referral to ENT  2. Acute nonintractable headache, unspecified headache type - Ambulatory referral to ENT  3. Allergic rhinitis due to pollen, unspecified seasonality - cetirizine (ZYRTEC) 5 MG tablet; TAKE ONE (1) TABLET EACH DAY  Dispense: 90 tablet; Refill: 2 - fluticasone (FLONASE) 50 MCG/ACT nasal spray; Place 1 spray into both nostrils 2 (two) times daily as needed for allergies or rhinitis.  Dispense: 16 g; Refill: 6 - Ambulatory referral to ENT    I believe this is related to allergies. Pt states he has never seen an ENT even though pt has been referred twice. Pt has had chronic sinus issues over the last several years and would benefit from ENT.  Discussed taking Flonase every day and zyrtec!! Continue Antivert as needed for dizziness  Falls precaution discussed RTO prn and keep PCP follow up  Evelina Dun, FNP

## 2017-03-22 ENCOUNTER — Ambulatory Visit (INDEPENDENT_AMBULATORY_CARE_PROVIDER_SITE_OTHER): Payer: Medicare HMO | Admitting: Family Medicine

## 2017-03-22 ENCOUNTER — Encounter: Payer: Self-pay | Admitting: Family Medicine

## 2017-03-22 VITALS — BP 131/66 | HR 69 | Temp 98.1°F | Ht 73.5 in | Wt 244.6 lb

## 2017-03-22 DIAGNOSIS — H1013 Acute atopic conjunctivitis, bilateral: Secondary | ICD-10-CM

## 2017-03-22 DIAGNOSIS — E669 Obesity, unspecified: Secondary | ICD-10-CM

## 2017-03-22 DIAGNOSIS — Z23 Encounter for immunization: Secondary | ICD-10-CM

## 2017-03-22 LAB — BAYER DCA HB A1C WAIVED: HB A1C (BAYER DCA - WAIVED): 6.2 % (ref <7.0)

## 2017-03-22 MED ORDER — CETIRIZINE HCL 10 MG PO TABS: ORAL_TABLET | ORAL | 3 refills | Status: DC

## 2017-03-22 NOTE — Progress Notes (Signed)
   HPI  Patient presents today here for follow up chronic medical problems, allergies, and flu shot  Allergies - Started after mowing grass a few days ago, he GI's, nasal congestion, improving with Flonase.  Patient would like to be checked for diabetes. He is recreationally and occupational active, he does not watch his diet.    PMH: Smoking status noted ROS: Per HPI  Objective: BP 131/66   Pulse 69   Temp 98.1 F (36.7 C) (Oral)   Ht 6' 1.5" (1.867 m)   Wt 244 lb 9.6 oz (110.9 kg)   BMI 31.83 kg/m  Gen: NAD, alert, cooperative with exam HEENT: NCAT, conjunctivae injected, turbinates swollen with boggy mucosa bilateral nares CV: RRR, good S1/S2, no murmur Resp: CTABL, no wheezes, non-labored Ext: No edema, warm Neuro: Alert and oriented, No gross deficits  Assessment and plan:  # Obesity Discussed therapeutic lifestyle changes, labs today including A1c per his request  # allergic conhjunctivitis Continue flonase for nasal symps, add zyrtec  # Need for flu immunization Counseling provided for all vaccine compnonets   Laroy Apple, MD [facility] 03/22/2017, 9:49 AM

## 2017-03-26 ENCOUNTER — Ambulatory Visit (INDEPENDENT_AMBULATORY_CARE_PROVIDER_SITE_OTHER): Payer: Medicare HMO | Admitting: Otolaryngology

## 2017-03-26 DIAGNOSIS — H6983 Other specified disorders of Eustachian tube, bilateral: Secondary | ICD-10-CM

## 2017-03-26 DIAGNOSIS — H903 Sensorineural hearing loss, bilateral: Secondary | ICD-10-CM | POA: Diagnosis not present

## 2017-03-26 LAB — CBC WITH DIFFERENTIAL/PLATELET
BASOS: 1 %
Basophils Absolute: 0 10*3/uL (ref 0.0–0.2)
EOS (ABSOLUTE): 0 10*3/uL (ref 0.0–0.4)
EOS: 1 %
Hematocrit: 48.3 % (ref 37.5–51.0)
Hemoglobin: 16.2 g/dL (ref 13.0–17.7)
IMMATURE GRANULOCYTES: 0 %
Immature Grans (Abs): 0 10*3/uL (ref 0.0–0.1)
LYMPHS ABS: 1.4 10*3/uL (ref 0.7–3.1)
Lymphs: 28 %
MCH: 30.6 pg (ref 26.6–33.0)
MCHC: 33.5 g/dL (ref 31.5–35.7)
MCV: 91 fL (ref 79–97)
MONOS ABS: 0.3 10*3/uL (ref 0.1–0.9)
Monocytes: 7 %
NEUTROS ABS: 3.1 10*3/uL (ref 1.4–7.0)
Neutrophils: 63 %
PLATELETS: 199 10*3/uL (ref 150–379)
RBC: 5.29 x10E6/uL (ref 4.14–5.80)
RDW: 13.7 % (ref 12.3–15.4)
WBC: 4.9 10*3/uL (ref 3.4–10.8)

## 2017-03-26 LAB — CMP14+EGFR
ALBUMIN: 4.8 g/dL (ref 3.5–4.8)
ALT: 21 IU/L (ref 0–44)
AST: 21 IU/L (ref 0–40)
Albumin/Globulin Ratio: 1.8 (ref 1.2–2.2)
Alkaline Phosphatase: 141 IU/L — ABNORMAL HIGH (ref 39–117)
BUN / CREAT RATIO: 16 (ref 10–24)
BUN: 15 mg/dL (ref 8–27)
Bilirubin Total: 0.4 mg/dL (ref 0.0–1.2)
CO2: 23 mmol/L (ref 20–29)
CREATININE: 0.91 mg/dL (ref 0.76–1.27)
Calcium: 9 mg/dL (ref 8.6–10.2)
Chloride: 100 mmol/L (ref 96–106)
GFR, EST AFRICAN AMERICAN: 92 mL/min/{1.73_m2} (ref >59)
GFR, EST NON AFRICAN AMERICAN: 80 mL/min/{1.73_m2} (ref >59)
GLUCOSE: 102 mg/dL — AB (ref 65–99)
Globulin, Total: 2.6 g/dL (ref 1.5–4.5)
POTASSIUM: 4.4 mmol/L (ref 3.5–5.2)
SODIUM: 141 mmol/L (ref 134–144)
TOTAL PROTEIN: 7.4 g/dL (ref 6.0–8.5)

## 2017-03-26 LAB — LIPID PANEL
Chol/HDL Ratio: 6.6 ratio — ABNORMAL HIGH (ref 0.0–5.0)
Cholesterol, Total: 166 mg/dL (ref 100–199)
HDL: 25 mg/dL — ABNORMAL LOW (ref >39)
LDL Calculated: 94 mg/dL (ref 0–99)
Triglycerides: 234 mg/dL — ABNORMAL HIGH (ref 0–149)
VLDL Cholesterol Cal: 47 mg/dL — ABNORMAL HIGH (ref 5–40)

## 2017-03-26 LAB — TSH: TSH: 2.23 u[IU]/mL (ref 0.450–4.500)

## 2017-03-27 ENCOUNTER — Telehealth: Payer: Self-pay | Admitting: Family Medicine

## 2017-03-27 NOTE — Telephone Encounter (Addendum)
Talked to His daughter Medical sales representative ( per ROI).   New DX of pre-diabetes, recommend diabetic class at [facility]. Other labs ok.   Laroy Apple, MD [facility] 03/27/2017, 12:39 PM

## 2017-04-24 ENCOUNTER — Other Ambulatory Visit: Payer: Self-pay | Admitting: Family Medicine

## 2017-04-24 DIAGNOSIS — I10 Essential (primary) hypertension: Secondary | ICD-10-CM

## 2017-05-09 ENCOUNTER — Ambulatory Visit (INDEPENDENT_AMBULATORY_CARE_PROVIDER_SITE_OTHER): Payer: Medicare HMO | Admitting: *Deleted

## 2017-05-09 VITALS — BP 155/88 | HR 75 | Ht 71.0 in | Wt 242.0 lb

## 2017-05-09 DIAGNOSIS — Z0000 Encounter for general adult medical examination without abnormal findings: Secondary | ICD-10-CM | POA: Diagnosis not present

## 2017-05-09 NOTE — Patient Instructions (Addendum)
  [name] , Thank you for taking time to come for your Medicare Wellness Visit. I appreciate your ongoing commitment to your health goals. Please review the following plan we discussed and let me know if I can assist you in the future.   These are the goals we discussed: Goals    . Exercise 3x per week (30 min per time)       This is a list of the screening recommended for you and due dates:  Health Maintenance  Topic Date Due  . Tetanus Vaccine  10/04/2022  . Flu Shot  Completed  . Pneumonia vaccines  Completed   Keep follow up appointment with Dr. Wendi Snipes in 09/2017. Continue to check on hearing aids

## 2017-05-09 NOTE — Progress Notes (Signed)
Subjective:   Nicholas Hawkins is a 81 y.o. male who presents for an Initial Medicare Annual Wellness Visit. Patient lives at home with his wife. They have 2 adult daughters and 3 grandchildren. He enjoys fishing, hunting, and working with his animals. Patient states that his diet is semi-healthy.   Review of Systems  Patient states that he feels his health is better than it was last year at this time. Patient states since having his knee surgery that he has been much better.  Cardiac Risk Factors include: advanced age (>65men, >63 women);hypertension;male gender;obesity (BMI >30kg/m2)    Objective:    Today's Vitals   05/09/17 1555 05/09/17 1602  BP: (!) 149/80 (!) 155/88  Pulse: 73 75  Weight: 242 lb (109.8 kg)   Height: 5\' 11"  (1.803 m)    Body mass index is 33.75 kg/m.  Current Medications (verified) Outpatient Encounter Medications as of 05/09/2017  Medication Sig  . cetirizine (ZYRTEC) 10 MG tablet TAKE ONE (1) TABLET EACH DAY  . fluticasone (FLONASE) 50 MCG/ACT nasal spray Place 1 spray into both nostrils 2 (two) times daily as needed for allergies or rhinitis.  Marland Kitchen lisinopril (PRINIVIL,ZESTRIL) 10 MG tablet TAKE ONE (1) TABLET EACH DAY  . PARoxetine (PAXIL) 20 MG tablet TAKE ONE TABLET EVERY MORNING   No facility-administered encounter medications on file as of 05/09/2017.     Allergies (verified) Patient has no known allergies.   History: Past Medical History:  Diagnosis Date  . Hyperlipidemia   . HYPERTENSION, MILD   . Low testosterone   . Obesity   . Obesity, unspecified    Past Surgical History:  Procedure Laterality Date  . APPENDECTOMY    . KNEE SURGERY Right    Family History  Problem Relation Age of Onset  . Heart disease Mother   . Early death Sister   . Aneurysm Brother    Social History   Occupational History  . Not on file  Tobacco Use  . Smoking status: Never Smoker  . Smokeless tobacco: Never Used  Substance and Sexual Activity  .  Alcohol use: No  . Drug use: No  . Sexual activity: No   Tobacco Counseling Counseling given: Not Answered Patient has never been a smoker   Activities of Daily Living In your present state of health, do you have any difficulty performing the following activities: 05/09/2017  Hearing? Y  Comment Is in the process of getting hearing aids   Vision? N  Difficulty concentrating or making decisions? N  Walking or climbing stairs? N  Dressing or bathing? N  Preparing Food and eating ? N  Using the Toilet? N  In the past six months, have you accidently leaked urine? N  Do you have problems with loss of bowel control? N  Managing your Medications? N  Managing your Finances? N  Housekeeping or managing your Housekeeping? N  Some recent data might be hidden  Patient does have difficulty hearing and he is in the process of trying to get hearing aids.   Immunizations and Health Maintenance Immunization History  Administered Date(s) Administered  . Influenza, High Dose Seasonal PF 03/22/2017  . Influenza,inj,Quad PF,6+ Mos 03/06/2013, 04/07/2014, 04/30/2015, 03/22/2016  . Pneumococcal Conjugate-13 07/14/2013  . Pneumococcal-Unspecified 06/12/2005  . Tdap 10/03/2012   There are no preventive care reminders to display for this patient.  Patient Care Team: Timmothy Euler, MD as PCP - General (Family Medicine)  Indicate any recent Medical Services you may have received  from other than [facility] providers in the past year (date may be approximate).    Assessment:   This is a routine wellness examination for [facility].   Hearing/Vision screen No exam data present  Patient has no concern for his vision. Patient is in the process of trying to get hearing aids.   Dietary issues and exercise activities discussed: Current Exercise Habits: Home exercise routine, Type of exercise: walking, Time (Minutes): 30, Frequency (Times/Week): 5, Weekly Exercise (Minutes/Week): 150, Intensity: Mild  Goals      . Exercise 3x per week (30 min per time)      Depression Screen PHQ 2/9 Scores 05/09/2017 03/22/2017 02/14/2017 10/10/2016  PHQ - 2 Score 0 0 0 0    Fall Risk Fall Risk  05/09/2017 03/22/2017 02/14/2017 10/10/2016 09/19/2016  Falls in the past year? No No No No No  Number falls in past yr: - - - - -  Injury with Fall? - - - - -    Cognitive Function: MMSE - Mini Mental State Exam 05/09/2017  Not completed: Unable to complete  Patient has trouble hearing and had a hard time answering the questions.       Screening Tests Health Maintenance  Topic Date Due  . TETANUS/TDAP  10/04/2022  . INFLUENZA VACCINE  Completed  . PNA vac Low Risk Adult  Completed        Plan:  Patient will continue to try working on getting his hearing aids. Patient will keep follow up appointment with Dr. Wendi Snipes in 09/2017.  Patient will continue to monitor blood pressure.  I have personally reviewed and noted the following in the patient's chart:   . Medical and social history . Use of alcohol, tobacco or illicit drugs  . Current medications and supplements . Functional ability and status . Nutritional status . Physical activity . Advanced directives . List of other physicians . Hospitalizations, surgeries, and ER visits in previous 12 months . Vitals . Screenings to include cognitive, depression, and falls . Referrals and appointments  In addition, I have reviewed and discussed with patient certain preventive protocols, quality metrics, and best practice recommendations. A written personalized care plan for preventive services as well as general preventive health recommendations were provided to patient.     Gareth Morgan, LPN   71/69/6789    I have reviewed and agree with the above AWV documentation.   Laroy Apple, MD [facility] 05/09/2017, 5:12 PM

## 2017-07-31 ENCOUNTER — Other Ambulatory Visit: Payer: Self-pay | Admitting: Family Medicine

## 2017-08-25 ENCOUNTER — Ambulatory Visit: Payer: Medicare HMO | Admitting: Family Medicine

## 2017-08-25 ENCOUNTER — Encounter: Payer: Self-pay | Admitting: Family Medicine

## 2017-08-25 ENCOUNTER — Ambulatory Visit (INDEPENDENT_AMBULATORY_CARE_PROVIDER_SITE_OTHER): Payer: Medicare HMO | Admitting: Family Medicine

## 2017-08-25 VITALS — BP 115/83 | HR 89 | Temp 100.0°F | Ht 71.0 in | Wt 241.0 lb

## 2017-08-25 DIAGNOSIS — R42 Dizziness and giddiness: Secondary | ICD-10-CM | POA: Diagnosis not present

## 2017-08-25 DIAGNOSIS — R531 Weakness: Secondary | ICD-10-CM | POA: Diagnosis not present

## 2017-08-25 DIAGNOSIS — H6502 Acute serous otitis media, left ear: Secondary | ICD-10-CM

## 2017-08-25 MED ORDER — AMOXICILLIN-POT CLAVULANATE 875-125 MG PO TABS: 1.0000 | ORAL_TABLET | Freq: Two times a day (BID) | ORAL | 0 refills | Status: DC

## 2017-08-25 NOTE — Progress Notes (Signed)
Subjective:  Patient ID: Nicholas Hawkins, male    DOB: December 04, 1935  Age: 82 y.o. MRN: [id number]  CC: Extremity Weakness (lethargic  dizzy x 2 days)   HPI Nicholas Hawkins presents for onset 2 days ago of feeling wobbly and off balance when he was trying to feed his dogs 2 days ago in the morning.  He had difficulty just making it back to the house.  Once he got home he was very tired so he slept all day and all night and his daughter says he slept again the next day as well.  He is been off balance but denies room spinning.  He denies things moving around when he is sitting still or moving.  He just feels unsteady like she cannot walk a straight line.  No syncope noted.  He has no focal numbness or weakness of arm or leg.  His daughter is with him and gives much of the history stating that he just retired and may be developing some depression as well.  He has taken Paxil in the past  Depression screen [facility] 2/9 08/25/2017 05/09/2017 03/22/2017  Decreased Interest 0 0 0  Down, Depressed, Hopeless 0 0 0  PHQ - 2 Score 0 0 0    History Nicholas Hawkins has a past medical history of Hyperlipidemia, HYPERTENSION, MILD, Low testosterone, Obesity, and Obesity, unspecified.   He has a past surgical history that includes Appendectomy and Knee surgery (Right).   His family history includes Aneurysm in his brother; Early death in his sister; Heart disease in his mother.He reports that  has never smoked. he has never used smokeless tobacco. He reports that he does not drink alcohol or use drugs.    ROS Review of Systems  Constitutional: Negative for chills, diaphoresis, fever and unexpected weight change.  HENT: Negative for congestion, hearing loss, rhinorrhea and sore throat.   Eyes: Negative for visual disturbance.  Respiratory: Negative for cough and shortness of breath.   Cardiovascular: Negative for chest pain.  Gastrointestinal: Negative for abdominal pain, constipation and diarrhea.  Genitourinary:  Negative for dysuria and flank pain.  Musculoskeletal: Negative for arthralgias and joint swelling.  Skin: Negative for rash.  Neurological: Positive for dizziness and light-headedness. Negative for seizures, syncope, facial asymmetry, speech difficulty, weakness, numbness and headaches.  Psychiatric/Behavioral: Positive for dysphoric mood. Negative for agitation, behavioral problems and confusion.    Objective:  BP 115/83   Pulse 89   Temp 100 F (37.8 C) (Oral)   Ht '5\' 11"'$  (1.803 m)   Wt 241 lb (109.3 kg)   BMI 33.61 kg/m   BP Readings from Last 3 Encounters:  08/25/17 115/83  05/09/17 (!) 155/88  03/22/17 131/66    Wt Readings from Last 3 Encounters:  08/25/17 241 lb (109.3 kg)  05/09/17 242 lb (109.8 kg)  03/22/17 244 lb 9.6 oz (110.9 kg)     Physical Exam  Constitutional: He is oriented to person, place, and time. He appears well-developed and well-nourished. No distress.  HENT:  Head: Normocephalic and atraumatic.  Right Ear: External ear normal.  Left Ear: External ear normal.  Nose: Nose normal.  Mouth/Throat: Oropharynx is clear and moist.  Eyes: Conjunctivae and EOM are normal. Pupils are equal, round, and reactive to light.  Neck: Normal range of motion. Neck supple. No thyromegaly present.  Cardiovascular: Normal rate, regular rhythm and normal heart sounds.  No murmur heard. Pulmonary/Chest: Effort normal and breath sounds normal. No respiratory distress. He has no wheezes.  He has no rales.  Abdominal: Soft. He exhibits no distension.  Lymphadenopathy:    He has no cervical adenopathy.  Neurological: He is alert and oriented to person, place, and time. He has normal reflexes.  Skin: Skin is warm and dry.  Psychiatric: He has a normal mood and affect. His behavior is normal.      Assessment & Plan:   Nicholas Hawkins was seen today for extremity weakness.  Diagnoses and all orders for this visit:  Dizziness -     CBC with Differential/Platelet -      CMP14+EGFR -     Sedimentation rate -     Urinalysis -     Urine Culture  Acute serous otitis media of left ear, recurrence not specified  Other orders -     amoxicillin-clavulanate (AUGMENTIN) 875-125 MG tablet; Take 1 tablet by mouth 2 (two) times daily. Take all of this medication       I am having Nicholas Daily "Ted" start on amoxicillin-clavulanate. I am also having him maintain his fluticasone, cetirizine, lisinopril, and PARoxetine.  Allergies as of 08/25/2017   No Known Allergies     Medication List        Accurate as of 08/25/17 11:17 AM. Always use your most recent med list.          amoxicillin-clavulanate 875-125 MG tablet Commonly known as:  AUGMENTIN Take 1 tablet by mouth 2 (two) times daily. Take all of this medication   cetirizine 10 MG tablet Commonly known as:  ZYRTEC TAKE ONE (1) TABLET EACH DAY   fluticasone 50 MCG/ACT nasal spray Commonly known as:  FLONASE Place 1 spray into both nostrils 2 (two) times daily as needed for allergies or rhinitis.   lisinopril 10 MG tablet Commonly known as:  PRINIVIL,ZESTRIL TAKE ONE (1) TABLET EACH DAY   PARoxetine 20 MG tablet Commonly known as:  PAXIL TAKE ONE TABLET EVERY MORNING        Follow-up: Return in about 1 week (around 09/01/2017).  Claretta Fraise, M.D.

## 2017-08-26 LAB — CMP14+EGFR
ALBUMIN: 4.4 g/dL (ref 3.5–4.8)
ALT: 40 IU/L (ref 0–44)
AST: 44 IU/L — AB (ref 0–40)
Albumin/Globulin Ratio: 1.6 (ref 1.2–2.2)
Alkaline Phosphatase: 129 IU/L — ABNORMAL HIGH (ref 39–117)
BUN/Creatinine Ratio: 12 (ref 10–24)
BUN: 13 mg/dL (ref 8–27)
Bilirubin Total: 1.1 mg/dL (ref 0.0–1.2)
CALCIUM: 8.9 mg/dL (ref 8.6–10.2)
CO2: 23 mmol/L (ref 20–29)
CREATININE: 1.06 mg/dL (ref 0.76–1.27)
Chloride: 94 mmol/L — ABNORMAL LOW (ref 96–106)
GFR, EST AFRICAN AMERICAN: 77 mL/min/{1.73_m2} (ref >59)
GFR, EST NON AFRICAN AMERICAN: 66 mL/min/{1.73_m2} (ref >59)
GLOBULIN, TOTAL: 2.8 g/dL (ref 1.5–4.5)
Glucose: 120 mg/dL — ABNORMAL HIGH (ref 65–99)
Potassium: 4 mmol/L (ref 3.5–5.2)
SODIUM: 136 mmol/L (ref 134–144)
TOTAL PROTEIN: 7.2 g/dL (ref 6.0–8.5)

## 2017-08-26 LAB — CBC WITH DIFFERENTIAL/PLATELET
BASOS: 1 %
Basophils Absolute: 0 10*3/uL (ref 0.0–0.2)
EOS (ABSOLUTE): 0 10*3/uL (ref 0.0–0.4)
Eos: 0 %
HEMOGLOBIN: 16.3 g/dL (ref 13.0–17.7)
Hematocrit: 48 % (ref 37.5–51.0)
IMMATURE GRANS (ABS): 0 10*3/uL (ref 0.0–0.1)
IMMATURE GRANULOCYTES: 0 %
Lymphocytes Absolute: 0.7 10*3/uL (ref 0.7–3.1)
Lymphs: 19 %
MCH: 29.5 pg (ref 26.6–33.0)
MCHC: 34 g/dL (ref 31.5–35.7)
MCV: 87 fL (ref 79–97)
MONOCYTES: 9 %
Monocytes Absolute: 0.3 10*3/uL (ref 0.1–0.9)
NEUTROS PCT: 71 %
Neutrophils Absolute: 2.6 10*3/uL (ref 1.4–7.0)
Platelets: 107 10*3/uL — ABNORMAL LOW (ref 150–379)
RBC: 5.53 x10E6/uL (ref 4.14–5.80)
RDW: 13.8 % (ref 12.3–15.4)
WBC: 3.6 10*3/uL (ref 3.4–10.8)

## 2017-08-26 LAB — URINE CULTURE: Organism ID, Bacteria: NO GROWTH

## 2017-08-26 LAB — SEDIMENTATION RATE: Sed Rate: 2 mm/hr (ref 0–30)

## 2017-08-27 ENCOUNTER — Other Ambulatory Visit: Payer: Self-pay

## 2017-08-27 ENCOUNTER — Emergency Department (HOSPITAL_COMMUNITY)
Admission: EM | Admit: 2017-08-27 | Discharge: 2017-08-27 | Disposition: A | Discharge status: Home / Self Care | Payer: Medicare HMO | Attending: Emergency Medicine | Admitting: Emergency Medicine

## 2017-08-27 ENCOUNTER — Telehealth: Payer: Self-pay | Admitting: Family Medicine

## 2017-08-27 ENCOUNTER — Emergency Department (HOSPITAL_COMMUNITY): Payer: Medicare HMO

## 2017-08-27 ENCOUNTER — Encounter (HOSPITAL_COMMUNITY): Payer: Self-pay

## 2017-08-27 DIAGNOSIS — R531 Weakness: Secondary | ICD-10-CM | POA: Diagnosis not present

## 2017-08-27 DIAGNOSIS — I10 Essential (primary) hypertension: Secondary | ICD-10-CM | POA: Diagnosis not present

## 2017-08-27 DIAGNOSIS — R110 Nausea: Secondary | ICD-10-CM | POA: Insufficient documentation

## 2017-08-27 DIAGNOSIS — R509 Fever, unspecified: Secondary | ICD-10-CM | POA: Insufficient documentation

## 2017-08-27 DIAGNOSIS — M7918 Myalgia, other site: Secondary | ICD-10-CM | POA: Insufficient documentation

## 2017-08-27 DIAGNOSIS — M6281 Muscle weakness (generalized): Secondary | ICD-10-CM | POA: Diagnosis not present

## 2017-08-27 DIAGNOSIS — J3489 Other specified disorders of nose and nasal sinuses: Secondary | ICD-10-CM | POA: Insufficient documentation

## 2017-08-27 DIAGNOSIS — J9811 Atelectasis: Secondary | ICD-10-CM | POA: Diagnosis not present

## 2017-08-27 LAB — URINALYSIS, ROUTINE W REFLEX MICROSCOPIC
Bacteria, UA: NONE SEEN
Bilirubin Urine: NEGATIVE
Glucose, UA: NEGATIVE mg/dL
Hgb urine dipstick: NEGATIVE
Ketones, ur: NEGATIVE mg/dL
Leukocytes, UA: NEGATIVE
NITRITE: NEGATIVE
Protein, ur: 30 mg/dL — AB
SPECIFIC GRAVITY, URINE: 1.023 (ref 1.005–1.030)
pH: 5 (ref 5.0–8.0)

## 2017-08-27 LAB — COMPREHENSIVE METABOLIC PANEL
ALBUMIN: 3.5 g/dL (ref 3.5–5.0)
ALK PHOS: 107 U/L (ref 38–126)
ALT: 60 U/L (ref 17–63)
AST: 71 U/L — AB (ref 15–41)
Anion gap: 11 (ref 5–15)
BILIRUBIN TOTAL: 1.5 mg/dL — AB (ref 0.3–1.2)
BUN: 22 mg/dL — AB (ref 6–20)
CALCIUM: 8.1 mg/dL — AB (ref 8.9–10.3)
CO2: 24 mmol/L (ref 22–32)
CREATININE: 1.11 mg/dL (ref 0.61–1.24)
Chloride: 95 mmol/L — ABNORMAL LOW (ref 101–111)
GFR calc Af Amer: >60 mL/min (ref >60)
GLUCOSE: 106 mg/dL — AB (ref 65–99)
Potassium: 3.4 mmol/L — ABNORMAL LOW (ref 3.5–5.1)
Sodium: 130 mmol/L — ABNORMAL LOW (ref 135–145)
TOTAL PROTEIN: 6.6 g/dL (ref 6.5–8.1)

## 2017-08-27 LAB — TROPONIN I: Troponin I: <0.03 ng/mL (ref <0.03)

## 2017-08-27 LAB — CBC WITH DIFFERENTIAL/PLATELET
BASOS ABS: 0.1 10*3/uL (ref 0.0–0.1)
Basophils Relative: 2 %
EOS ABS: 0 10*3/uL (ref 0.0–0.7)
EOS PCT: 0 %
HCT: 41.8 % (ref 39.0–52.0)
Hemoglobin: 14.1 g/dL (ref 13.0–17.0)
LYMPHS PCT: 18 %
Lymphs Abs: 0.5 10*3/uL — ABNORMAL LOW (ref 0.7–4.0)
MCH: 29.6 pg (ref 26.0–34.0)
MCHC: 33.7 g/dL (ref 30.0–36.0)
MCV: 87.6 fL (ref 78.0–100.0)
MONO ABS: 0.5 10*3/uL (ref 0.1–1.0)
Monocytes Relative: 18 %
Neutro Abs: 1.6 10*3/uL — ABNORMAL LOW (ref 1.7–7.7)
Neutrophils Relative %: 62 %
PLATELETS: 86 10*3/uL — AB (ref 150–400)
RBC: 4.77 MIL/uL (ref 4.22–5.81)
RDW: 13.2 % (ref 11.5–15.5)
WBC: 2.6 10*3/uL — AB (ref 4.0–10.5)

## 2017-08-27 LAB — URINALYSIS
Bilirubin, UA: NEGATIVE
Glucose, UA: NEGATIVE
KETONES UA: NEGATIVE
Leukocytes, UA: NEGATIVE
NITRITE UA: NEGATIVE
Specific Gravity, UA: 1.025 (ref 1.005–1.030)
UUROB: 0.2 mg/dL (ref 0.2–1.0)
pH, UA: 5.5 (ref 5.0–7.5)

## 2017-08-27 LAB — INFLUENZA PANEL BY PCR (TYPE A & B)
INFLBPCR: NEGATIVE
Influenza A By PCR: NEGATIVE

## 2017-08-27 LAB — RAPID STREP SCREEN (MED CTR MEBANE ONLY): STREPTOCOCCUS, GROUP A SCREEN (DIRECT): NEGATIVE

## 2017-08-27 LAB — PROTIME-INR
INR: 1.11
Prothrombin Time: 14.2 seconds (ref 11.4–15.2)

## 2017-08-27 LAB — LACTIC ACID, PLASMA: LACTIC ACID, VENOUS: 1.5 mmol/L (ref 0.5–1.9)

## 2017-08-27 LAB — I-STAT CG4 LACTIC ACID, ED
Lactic Acid, Venous: 1.56 mmol/L (ref 0.5–1.9)
Lactic Acid, Venous: 1.19 mmol/L (ref 0.5–1.9)

## 2017-08-27 MED ORDER — SODIUM CHLORIDE 0.9 % IV BOLUS (SEPSIS)
1000.0000 mL | Freq: Once | INTRAVENOUS | Status: AC
  Administered 2017-08-27: 1000 mL via INTRAVENOUS

## 2017-08-27 MED ORDER — SODIUM CHLORIDE 0.9 % IV BOLUS (SEPSIS)
1000.0000 mL | Freq: Once | INTRAVENOUS | Status: AC
Start: 2017-08-27 — End: 2017-08-27
  Administered 2017-08-27: 1000 mL via INTRAVENOUS

## 2017-08-27 MED ORDER — ONDANSETRON HCL 4 MG/2ML IJ SOLN
4.0000 mg | Freq: Once | INTRAMUSCULAR | Status: AC
  Administered 2017-08-27: 4 mg via INTRAVENOUS
  Filled 2017-08-27: qty 2

## 2017-08-27 NOTE — ED Provider Notes (Signed)
Emergency Department Provider Note   I have reviewed the triage vital signs and the nursing notes.   HISTORY  Chief Complaint Fever   HPI Nicholas Hawkins is a 82 y.o. male without significant past medical history the presents to the emergency department today secondary to intermittent fever.  The daughters accompany him and state that over the last your days the patient has had increased weakness, tiredness, decreased appetite and intake.  Has had a temperature as high as 100.0 measured but has had fevers and chills multiple times.  They state that he will be doing okay then all of a sudden he will just get very very cold or very very hot and sweaty.  This will last for half an hour 45 minutes and then patient will improve.  During this time the patient has just been more sleepy than normal dermis sick like this before.  He did see his primary doctor on Saturday but he thought maybe it was his teeth or his ears causing the problems that start him on Augmentin but he has not gotten any better.  They do endorse him a tick stuck to him last week but patient does not endorse any rash.  He does endorse some body aches and a little bit of runny nose but no other symptoms.  He has not had any abdominal pain, back pain, dysuria, vomiting.  He does have nausea sometimes.  States his urine is darker than normal but not particularly malodorous.  She states that he did have some type of bump above 1 of his teeth that he pushed on and smelly stuff came out and intermittently he will have some malodorous discharge from. No other associated or modifying symptoms.    Past Medical History:  Diagnosis Date  . Hyperlipidemia   . HYPERTENSION, MILD   . Low testosterone   . Obesity   . Obesity, unspecified     Patient Active Problem List   Diagnosis Date Noted  . Hearing loss 07/14/2013  . Hyperlipidemia   . Low testosterone   . Paresthesias 03/06/2013  . Obesity 03/01/2010  . HYPERTENSION, MILD  03/01/2010  . NECK PAIN 03/01/2010  . Other fatigue 02/28/2010  . HEADACHE 02/28/2010    Past Surgical History:  Procedure Laterality Date  . APPENDECTOMY    . KNEE SURGERY Right     Current Outpatient Rx  . Order #: [id number] Class: Normal  . Order #: [id number] Class: No Print  . Order #: [id number] Class: Normal  . Order #: [id number] Class: Normal  . Order #: [id number] Class: Normal    Allergies Patient has no known allergies.  Family History  Problem Relation Age of Onset  . Heart disease Mother   . Early death Sister   . Aneurysm Brother     Social History Social History   Tobacco Use  . Smoking status: Never Smoker  . Smokeless tobacco: Never Used  Substance Use Topics  . Alcohol use: No  . Drug use: No    Review of Systems  All other systems negative except as documented in the HPI. All pertinent positives and negatives as reviewed in the HPI. ____________________________________________   PHYSICAL EXAM:  VITAL SIGNS: ED Triage Vitals [08/27/17 1514]  Enc Vitals Group     BP (!) 145/125     Pulse Rate 95     Resp 19     Temp (!) 92 F (33.3 C)     Temp Source Oral     SpO2 95 %  Weight 241 lb (109.3 kg)     Height 5\' 11"  (1.803 m)    Constitutional: Alert and oriented. Well appearing and in no acute distress. Eyes: Conjunctivae are normal. PERRL. EOMI. Head: Atraumatic. Nose: No congestion/rhinnorhea. Mouth/Throat: Mucous membranes are moist.  Oropharynx non-erythematous. No obvious abscesses or drainage or infection in oral cavity.  Neck: No stridor.  No meningeal signs.   Cardiovascular: Normal rate, regular rhythm. Good peripheral circulation. Grossly normal heart sounds.   Respiratory: Normal respiratory effort.  No retractions. Lungs CTAB. Gastrointestinal: Soft and nontender. No distention.  Musculoskeletal: No lower extremity tenderness nor edema. No gross deformities of extremities. Neurologic:  Normal speech and language. No gross  focal neurologic deficits are appreciated.  Skin:  Skin is warm, dry and intact. No rash noted.  ____________________________________________   LABS (all labs ordered are listed, but only abnormal results are displayed)  Labs Reviewed  COMPREHENSIVE METABOLIC PANEL - Abnormal; Notable for the following components:      Result Value   Sodium 130 (*)    Potassium 3.4 (*)    Chloride 95 (*)    Glucose, Bld 106 (*)    BUN 22 (*)    Calcium 8.1 (*)    AST 71 (*)    Total Bilirubin 1.5 (*)    All other components within normal limits  CBC WITH DIFFERENTIAL/PLATELET - Abnormal; Notable for the following components:   WBC 2.6 (*)    Platelets 86 (*)    Neutro Abs 1.6 (*)    Lymphs Abs 0.5 (*)    All other components within normal limits  URINALYSIS, ROUTINE W REFLEX MICROSCOPIC - Abnormal; Notable for the following components:   Color, Urine AMBER (*)    APPearance HAZY (*)    Protein, ur 30 (*)    Squamous Epithelial / LPF 0-5 (*)    All other components within normal limits  CULTURE, BLOOD (ROUTINE X 2)  CULTURE, BLOOD (ROUTINE X 2)  RAPID STREP SCREEN (NOT AT [facility])  CULTURE, GROUP A STREP ([facility])  PROTIME-INR  LACTIC ACID, PLASMA  INFLUENZA PANEL BY PCR (TYPE A & B)  TROPONIN I  I-STAT CG4 LACTIC ACID, ED  I-STAT CG4 LACTIC ACID, ED   ____________________________________________  EKG   EKG Interpretation  Date/Time:  Monday August 27 2017 17:17:49 EDT Ventricular Rate:  71 PR Interval:    QRS Duration: 135 QT Interval:  412 QTC Calculation: 448 R Axis:   -67 Text Interpretation:  Sinus rhythm Atrial premature complexes Nonspecific IVCD with LAD No significant change since last tracing Confirmed by Merrily Pew [phone]) on 08/27/2017 6:33:13 PM       ____________________________________________  RADIOLOGY  Dg Chest 2 View  Result Date: 08/27/2017 CLINICAL DATA:  Fever for 3 days.  Altered mental status. EXAM: CHEST - 2 VIEW COMPARISON:  PA and lateral chest  05/13/2015.  CT chest 04/20/2015. FINDINGS: Mild bibasilar atelectasis is seen. The lungs are otherwise clear. Heart size is normal. No pneumothorax or pleural fluid. Aortic atherosclerosis is noted. No acute bony abnormality. IMPRESSION: Mild bibasilar atelectasis. Atherosclerosis. Electronically Signed   By: Inge Rise M.D.   On: 08/27/2017 16:02    ____________________________________________   PROCEDURES  Procedure(s) performed:   Procedures   ____________________________________________   INITIAL IMPRESSION / ASSESSMENT AND PLAN / ED COURSE  Body aches runny nose and fever consider influenza however less likely.  My concern would be that he had a dental infection and is improving but now is bacteremic. Will  do sepsis workup otherwise.   Unremarkable.  Blood pressures initially but soft but after a couple liters of fluid he felt much better was able to ambulate without any difficulty or weakness.  Blood pressures were consistently in the 03Y-333O systolic.  Unclear what the exact cause of symptoms at this time.  Blood cultures are pending the patient prefers to go home.  He will come back if there are any changes otherwise will follow-up with his primary doctor.  Pertinent labs & imaging results that were available during my care of the patient were reviewed by me and considered in my medical decision making (see chart for details).  ____________________________________________  FINAL CLINICAL IMPRESSION(S) / ED DIAGNOSES  Final diagnoses:  Fever, unspecified fever cause  Weakness     MEDICATIONS GIVEN DURING THIS VISIT:  Medications  sodium chloride 0.9 % bolus 1,000 mL (0 mLs Intravenous Stopped 08/27/17 1825)  ondansetron (ZOFRAN) injection 4 mg (4 mg Intravenous Given 08/27/17 1725)  sodium chloride 0.9 % bolus 1,000 mL (0 mLs Intravenous Stopped 08/27/17 1953)     NEW OUTPATIENT MEDICATIONS STARTED DURING THIS VISIT:  Discharge Medication List as of 08/27/2017   9:59 PM      Note:  This note was prepared with assistance of Dragon voice recognition software. Occasional wrong-word or sound-a-like substitutions may have occurred due to the inherent limitations of voice recognition software.   Merrily Pew, MD 08/27/17 [phone]

## 2017-08-27 NOTE — ED Notes (Signed)
Urinal provided to pt. Pt aware need urine sample.

## 2017-08-27 NOTE — ED Triage Notes (Signed)
Pt has been running a fever for the last 3 days. Was having a problem with his tooth and was put on antibiotics on Saturday. Augmentin. States pt is now very lethargic and weak. Pt temp is 99.1 today. Was given Advil and Tylenol today at 1300. Pt's fever was 100 on Saturday.

## 2017-08-27 NOTE — Telephone Encounter (Signed)
I do not see anything major on the labs except for a mild elevation of 1 of his liver function numbers the rest of it looks essentially normal, await Dr. Livia Snellen to review this officially for him but I did not see anything that shows any major abnormalities, have the patient continue his Augmentin

## 2017-08-27 NOTE — ED Notes (Signed)
EDP aware only one IV established at this time. EDP reported one IV at this time would be appropriate.

## 2017-08-28 NOTE — Telephone Encounter (Signed)
Please see lab result note.

## 2017-08-29 NOTE — Telephone Encounter (Signed)
Please have him follow up in the office. Preferably with Dr. Wendi Snipes. His labs are most consistent with viral infection, but he also had a low sodium at the E.D. And some protein in his urine. If still feeling bad he should follow up

## 2017-08-30 LAB — CULTURE, GROUP A STREP (THRC)

## 2017-09-01 LAB — CULTURE, BLOOD (ROUTINE X 2)
Culture: NO GROWTH
Culture: NO GROWTH
SPECIAL REQUESTS: ADEQUATE
Special Requests: ADEQUATE

## 2017-09-05 NOTE — Telephone Encounter (Signed)
Patient went to ER 3/18

## 2017-09-20 ENCOUNTER — Encounter: Payer: Self-pay | Admitting: Family Medicine

## 2017-09-20 ENCOUNTER — Ambulatory Visit (INDEPENDENT_AMBULATORY_CARE_PROVIDER_SITE_OTHER): Payer: Medicare HMO | Admitting: Family Medicine

## 2017-09-20 VITALS — Temp 97.5°F | Ht 71.0 in | Wt 234.6 lb

## 2017-09-20 DIAGNOSIS — J301 Allergic rhinitis due to pollen: Secondary | ICD-10-CM

## 2017-09-20 DIAGNOSIS — I10 Essential (primary) hypertension: Secondary | ICD-10-CM | POA: Diagnosis not present

## 2017-09-20 DIAGNOSIS — R7303 Prediabetes: Secondary | ICD-10-CM | POA: Diagnosis not present

## 2017-09-20 LAB — BAYER DCA HB A1C WAIVED: HB A1C (BAYER DCA - WAIVED): 5.4 % (ref <7.0)

## 2017-09-20 MED ORDER — FLUTICASONE PROPIONATE 50 MCG/ACT NA SUSP
1.0000 | Freq: Two times a day (BID) | NASAL | 6 refills | Status: DC | PRN
Start: 2017-09-20 — End: 2018-10-11

## 2017-09-20 NOTE — Progress Notes (Signed)
   HPI  Patient presents today for follow-up.  Patient had recent illness, he states that he is much improved, he worked in the yard all day yesterday like usual.  He still has nasal congestion and sinus drainage. Denies fever, chills.  Hypertension Good medication compliance.  Prediabetes Patient would like to be rechecked Watching diet moderately  PMH: Smoking status noted ROS: Per HPI  Objective: Temp (!) 97.5 F (36.4 C) (Oral)   Ht '5\' 11"'$  (1.803 m)   Wt 234 lb 9.6 oz (106.4 kg)   BMI 32.72 kg/m  Gen: NAD, alert, cooperative with exam HEENT: NCAT, nasal mucosa boggy and enlarged turbinates bilaterally, no tenderness to palpation of maxillary or frontal sinuses CV: RRR, good S1/S2, no murmur Resp: CTABL, no wheezes, non-labored Abd: SNTND, BS present, no guarding or organomegaly Ext: No edema, warm Neuro: Alert and oriented, No gross deficits  Assessment and plan:  #Prediabetes Recheck A1c today Asymptomatic  #Hypertension Well-controlled on current medication, no changes Labs  #Allergic rhinitis Restart Flonase No signs of acute sinusitis    Orders Placed This Encounter  Procedures  . CMP14+EGFR  . CBC with Differential/Platelet  . Bayer DCA Hb A1c Waived    Meds ordered this encounter  Medications  . fluticasone (FLONASE) 50 MCG/ACT nasal spray    Sig: Place 1 spray into both nostrils 2 (two) times daily as needed for allergies or rhinitis.    Dispense:  16 g    Refill:  [place], MD [facility] 09/20/2017, 8:17 AM

## 2017-09-21 LAB — CBC WITH DIFFERENTIAL/PLATELET
BASOS: 1 %
Basophils Absolute: 0 10*3/uL (ref 0.0–0.2)
EOS (ABSOLUTE): 0.1 10*3/uL (ref 0.0–0.4)
Eos: 1 %
HEMATOCRIT: 46.4 % (ref 37.5–51.0)
Hemoglobin: 15.1 g/dL (ref 13.0–17.7)
IMMATURE GRANS (ABS): 0 10*3/uL (ref 0.0–0.1)
Immature Granulocytes: 0 %
Lymphocytes Absolute: 2.8 10*3/uL (ref 0.7–3.1)
Lymphs: 48 %
MCH: 29.3 pg (ref 26.6–33.0)
MCHC: 32.5 g/dL (ref 31.5–35.7)
MCV: 90 fL (ref 79–97)
Monocytes Absolute: 0.5 10*3/uL (ref 0.1–0.9)
Monocytes: 8 %
Neutrophils Absolute: 2.5 10*3/uL (ref 1.4–7.0)
Neutrophils: 42 %
PLATELETS: 189 10*3/uL (ref 150–379)
RBC: 5.15 x10E6/uL (ref 4.14–5.80)
RDW: 13.9 % (ref 12.3–15.4)
WBC: 5.9 10*3/uL (ref 3.4–10.8)

## 2017-09-21 LAB — CMP14+EGFR
A/G RATIO: 1.5 (ref 1.2–2.2)
ALBUMIN: 4.4 g/dL (ref 3.5–4.7)
ALT: 29 IU/L (ref 0–44)
AST: 22 IU/L (ref 0–40)
Alkaline Phosphatase: 115 IU/L (ref 39–117)
BUN / CREAT RATIO: 18 (ref 10–24)
BUN: 14 mg/dL (ref 8–27)
Bilirubin Total: 0.4 mg/dL (ref 0.0–1.2)
CALCIUM: 8.8 mg/dL (ref 8.6–10.2)
CO2: 24 mmol/L (ref 20–29)
Chloride: 99 mmol/L (ref 96–106)
Creatinine, Ser: 0.76 mg/dL (ref 0.76–1.27)
GFR calc Af Amer: 100 mL/min/{1.73_m2} (ref >59)
GFR, EST NON AFRICAN AMERICAN: 86 mL/min/{1.73_m2} (ref >59)
GLOBULIN, TOTAL: 2.9 g/dL (ref 1.5–4.5)
Glucose: 101 mg/dL — ABNORMAL HIGH (ref 65–99)
POTASSIUM: 4.1 mmol/L (ref 3.5–5.2)
SODIUM: 138 mmol/L (ref 134–144)
TOTAL PROTEIN: 7.3 g/dL (ref 6.0–8.5)

## 2017-10-29 DIAGNOSIS — H04123 Dry eye syndrome of bilateral lacrimal glands: Secondary | ICD-10-CM | POA: Diagnosis not present

## 2017-10-29 DIAGNOSIS — H43812 Vitreous degeneration, left eye: Secondary | ICD-10-CM | POA: Diagnosis not present

## 2017-10-29 DIAGNOSIS — H43393 Other vitreous opacities, bilateral: Secondary | ICD-10-CM | POA: Diagnosis not present

## 2017-10-29 DIAGNOSIS — H25813 Combined forms of age-related cataract, bilateral: Secondary | ICD-10-CM | POA: Diagnosis not present

## 2017-11-20 ENCOUNTER — Other Ambulatory Visit: Payer: Self-pay | Admitting: Family Medicine

## 2017-11-20 DIAGNOSIS — I10 Essential (primary) hypertension: Secondary | ICD-10-CM

## 2017-11-26 DIAGNOSIS — Z01818 Encounter for other preprocedural examination: Secondary | ICD-10-CM | POA: Diagnosis not present

## 2017-11-26 DIAGNOSIS — H25812 Combined forms of age-related cataract, left eye: Secondary | ICD-10-CM | POA: Diagnosis not present

## 2017-11-26 DIAGNOSIS — H02831 Dermatochalasis of right upper eyelid: Secondary | ICD-10-CM | POA: Diagnosis not present

## 2017-11-26 DIAGNOSIS — H25811 Combined forms of age-related cataract, right eye: Secondary | ICD-10-CM | POA: Diagnosis not present

## 2017-11-26 DIAGNOSIS — H25813 Combined forms of age-related cataract, bilateral: Secondary | ICD-10-CM | POA: Diagnosis not present

## 2017-12-20 DIAGNOSIS — H2512 Age-related nuclear cataract, left eye: Secondary | ICD-10-CM | POA: Diagnosis not present

## 2017-12-20 DIAGNOSIS — H25812 Combined forms of age-related cataract, left eye: Secondary | ICD-10-CM | POA: Diagnosis not present

## 2017-12-31 DIAGNOSIS — H11153 Pinguecula, bilateral: Secondary | ICD-10-CM | POA: Diagnosis not present

## 2017-12-31 DIAGNOSIS — Z961 Presence of intraocular lens: Secondary | ICD-10-CM | POA: Diagnosis not present

## 2017-12-31 DIAGNOSIS — H43812 Vitreous degeneration, left eye: Secondary | ICD-10-CM | POA: Diagnosis not present

## 2017-12-31 DIAGNOSIS — H04123 Dry eye syndrome of bilateral lacrimal glands: Secondary | ICD-10-CM | POA: Diagnosis not present

## 2018-03-06 ENCOUNTER — Ambulatory Visit (INDEPENDENT_AMBULATORY_CARE_PROVIDER_SITE_OTHER): Payer: Medicare HMO

## 2018-03-06 ENCOUNTER — Ambulatory Visit: Payer: Medicare HMO | Admitting: Physician Assistant

## 2018-03-06 ENCOUNTER — Encounter: Payer: Self-pay | Admitting: Physician Assistant

## 2018-03-06 ENCOUNTER — Other Ambulatory Visit: Payer: Self-pay | Admitting: Physician Assistant

## 2018-03-06 VITALS — BP 128/82 | HR 69 | Temp 97.0°F | Ht 71.0 in | Wt 237.0 lb

## 2018-03-06 DIAGNOSIS — R0781 Pleurodynia: Secondary | ICD-10-CM

## 2018-03-06 DIAGNOSIS — Z23 Encounter for immunization: Secondary | ICD-10-CM

## 2018-03-06 DIAGNOSIS — S2242XA Multiple fractures of ribs, left side, initial encounter for closed fracture: Secondary | ICD-10-CM | POA: Diagnosis not present

## 2018-03-06 MED ORDER — HYDROCODONE-ACETAMINOPHEN 5-325 MG PO TABS: 1.0000 | ORAL_TABLET | Freq: Four times a day (QID) | ORAL | 0 refills | Status: DC | PRN

## 2018-03-06 NOTE — Progress Notes (Signed)
BP 128/82   Pulse 69   Ht 5\' 11"  (1.803 m)   Wt 237 lb (107.5 kg)   BMI 33.05 kg/m    Subjective:    Patient ID: Nicholas Hawkins, male    DOB: [birth year], 82 y.o.   MRN: [id number]  HPI: Nicholas Hawkins is a 82 y.o. male presenting on 03/06/2018 for Chest Pain  He had to jump off of a piece of logging equipment 4 days ago. The machine did flip over and he was able to get away from it.  It has jumped he began having left anterior chest pain.  He has had problems in the past with fractured ribs.  She really feels like this is what was going on.  Over the last few days he has tried Tylenol and ice and has not had much relief.  He states he has significant pain whenever he moves his left arm.  He denies any fever or chills.  Past Medical History:  Diagnosis Date  . Hyperlipidemia   . HYPERTENSION, MILD   . Low testosterone   . Obesity   . Obesity, unspecified    Relevant past medical, surgical, family and social history reviewed and updated as indicated. Interim medical history since our last visit reviewed. Allergies and medications reviewed and updated. DATA REVIEWED: CHART IN EPIC  Family History reviewed for pertinent findings.  Review of Systems  Constitutional: Negative.  Negative for appetite change and fatigue.  Eyes: Negative for pain and visual disturbance.  Respiratory: Negative.  Negative for cough, chest tightness, shortness of breath and wheezing.   Cardiovascular: Positive for chest pain. Negative for palpitations and leg swelling.  Gastrointestinal: Negative.  Negative for abdominal pain, diarrhea, nausea and vomiting.  Genitourinary: Negative.   Musculoskeletal: Positive for arthralgias and myalgias.  Skin: Negative.  Negative for color change and rash.  Neurological: Negative.  Negative for weakness, numbness and headaches.  Psychiatric/Behavioral: Negative.     Allergies as of 03/06/2018   No Known Allergies     Medication List        Accurate as of  03/06/18  9:12 AM. Always use your most recent med list.          cetirizine 10 MG tablet Commonly known as:  ZYRTEC TAKE ONE (1) TABLET EACH DAY   fluticasone 50 MCG/ACT nasal spray Commonly known as:  FLONASE Place 1 spray into both nostrils 2 (two) times daily as needed for allergies or rhinitis.   HYDROcodone-acetaminophen 5-325 MG tablet Commonly known as:  NORCO/VICODIN Take 1 tablet by mouth every 6 (six) hours as needed for moderate pain.   lisinopril 10 MG tablet Commonly known as:  PRINIVIL,ZESTRIL TAKE ONE (1) TABLET EACH DAY   PARoxetine 20 MG tablet Commonly known as:  PAXIL TAKE ONE TABLET EVERY MORNING          Objective:    BP 128/82   Pulse 69   Ht 5\' 11"  (1.803 m)   Wt 237 lb (107.5 kg)   BMI 33.05 kg/m   No Known Allergies  Wt Readings from Last 3 Encounters:  03/06/18 237 lb (107.5 kg)  09/20/17 234 lb 9.6 oz (106.4 kg)  08/27/17 241 lb (109.3 kg)    Physical Exam  Constitutional: He appears well-developed and well-nourished. No distress.  HENT:  Head: Normocephalic and atraumatic.  Eyes: Pupils are equal, round, and reactive to light. Conjunctivae and EOM are normal.  Cardiovascular: Normal rate, regular rhythm and normal heart sounds.  Tender to the touch she does lift up the costal border of the sternum.  No crepitus is appreciated  Pulmonary/Chest: Effort normal and breath sounds normal. No respiratory distress.  Skin: Skin is warm and dry.  Psychiatric: He has a normal mood and affect. His behavior is normal.  Nursing note and vitals reviewed.       Assessment & Plan:   1. Rib pain - HYDROcodone-acetaminophen (NORCO/VICODIN) 5-325 MG tablet; Take 1 tablet by mouth every 6 (six) hours as needed for moderate pain.  Dispense: 30 tablet; Refill: 0  2. Closed fracture of multiple ribs of left side, initial encounter - HYDROcodone-acetaminophen (NORCO/VICODIN) 5-325 MG tablet; Take 1 tablet by mouth every 6 (six) hours as needed  for moderate pain.  Dispense: 30 tablet; Refill: 0 Repeat x-ray in 4 weeks Call if any symptoms worsen  Continue all other maintenance medications as listed above.  Follow up plan: Return in about 4 weeks (around 04/03/2018) for recheck and MD visit.  Educational handout given for [facility] PA-C [place] [address]  [place], [place] [address] [phone]   03/06/2018, 9:13 AM

## 2018-03-06 NOTE — Patient Instructions (Signed)

## 2018-03-07 ENCOUNTER — Other Ambulatory Visit: Payer: Self-pay | Admitting: *Deleted

## 2018-03-07 MED ORDER — PAROXETINE HCL 20 MG PO TABS: 20.0000 mg | ORAL_TABLET | Freq: Every morning | ORAL | 0 refills | Status: DC

## 2018-04-01 ENCOUNTER — Ambulatory Visit (INDEPENDENT_AMBULATORY_CARE_PROVIDER_SITE_OTHER): Payer: Medicare HMO | Admitting: Otolaryngology

## 2018-04-09 ENCOUNTER — Ambulatory Visit (INDEPENDENT_AMBULATORY_CARE_PROVIDER_SITE_OTHER): Payer: Medicare HMO | Admitting: Physician Assistant

## 2018-04-09 ENCOUNTER — Ambulatory Visit (INDEPENDENT_AMBULATORY_CARE_PROVIDER_SITE_OTHER): Payer: Medicare HMO

## 2018-04-09 ENCOUNTER — Encounter: Payer: Self-pay | Admitting: Physician Assistant

## 2018-04-09 VITALS — BP 128/73 | HR 72 | Temp 98.2°F | Ht 71.0 in | Wt 234.6 lb

## 2018-04-09 DIAGNOSIS — S2242XA Multiple fractures of ribs, left side, initial encounter for closed fracture: Secondary | ICD-10-CM

## 2018-04-09 DIAGNOSIS — R0781 Pleurodynia: Secondary | ICD-10-CM | POA: Diagnosis not present

## 2018-04-09 DIAGNOSIS — S2242XD Multiple fractures of ribs, left side, subsequent encounter for fracture with routine healing: Secondary | ICD-10-CM | POA: Diagnosis not present

## 2018-04-09 DIAGNOSIS — J209 Acute bronchitis, unspecified: Secondary | ICD-10-CM | POA: Diagnosis not present

## 2018-04-09 MED ORDER — CEFDINIR 300 MG PO CAPS: 300.0000 mg | ORAL_CAPSULE | Freq: Two times a day (BID) | ORAL | 0 refills | Status: DC

## 2018-04-11 NOTE — Progress Notes (Signed)
BP 128/73   Pulse 72   Temp 98.2 F (36.8 C) (Oral)   Ht _0  (1.803 m)   Wt 234 lb 9.6 oz (106.4 kg)   BMI 32.72 kg/m    Subjective:    Patient ID: Nicholas Hawkins, male    DOB: 11/29/1935, 82 y.o.   MRN: [id number]  HPI: Nicholas Hawkins is a 82 y.o. male presenting on 04/09/2018 for Chest Pain (4 week follow up ); Cough; and Sinusitis    Past Medical History:  Diagnosis Date  . Hyperlipidemia   . HYPERTENSION, MILD   . Low testosterone   . Obesity   . Obesity, unspecified    Relevant past medical, surgical, family and social history reviewed and updated as indicated. Interim medical history since our last visit reviewed. Allergies and medications reviewed and updated. DATA REVIEWED: CHART IN EPIC  Family History reviewed for pertinent findings.  Review of Systems  Constitutional: Positive for fatigue. Negative for appetite change.  HENT: Positive for congestion, sinus pressure and sore throat.   Eyes: Negative.  Negative for pain and visual disturbance.  Respiratory: Positive for cough. Negative for chest tightness, shortness of breath and wheezing.   Cardiovascular: Negative.  Negative for chest pain, palpitations and leg swelling.  Gastrointestinal: Negative.  Negative for abdominal pain, diarrhea, nausea and vomiting.  Endocrine: Negative.   Genitourinary: Negative.   Musculoskeletal: Positive for back pain and myalgias.  Skin: Negative.  Negative for color change and rash.  Neurological: Negative for weakness, numbness and headaches.  Psychiatric/Behavioral: Negative.     Allergies as of 04/09/2018   No Known Allergies     Medication List        Accurate as of 04/09/18 11:59 PM. Always use your most recent med list.          cefdinir 300 MG capsule Commonly known as:  OMNICEF Take 1 capsule (300 mg total) by mouth 2 (two) times daily. 1 po BID   cetirizine 10 MG tablet Commonly known as:  ZYRTEC TAKE ONE (1) TABLET EACH DAY   fluticasone 50  MCG/ACT nasal spray Commonly known as:  FLONASE Place 1 spray into both nostrils 2 (two) times daily as needed for allergies or rhinitis.   HYDROcodone-acetaminophen 5-325 MG tablet Commonly known as:  NORCO/VICODIN Take 1 tablet by mouth every 6 (six) hours as needed for moderate pain.   lisinopril 10 MG tablet Commonly known as:  PRINIVIL,ZESTRIL TAKE ONE (1) TABLET EACH DAY   PARoxetine 20 MG tablet Commonly known as:  PAXIL Take 1 tablet (20 mg total) by mouth every morning.          Objective:    BP 128/73   Pulse 72   Temp 98.2 F (36.8 C) (Oral)   Ht _1  (1.803 m)   Wt 234 lb 9.6 oz (106.4 kg)   BMI 32.72 kg/m   No Known Allergies  Wt Readings from Last 3 Encounters:  04/09/18 234 lb 9.6 oz (106.4 kg)  03/06/18 237 lb (107.5 kg)  09/20/17 234 lb 9.6 oz (106.4 kg)    Physical Exam  Constitutional: He appears well-developed and well-nourished.  HENT:  Head: Normocephalic and atraumatic.  Right Ear: Hearing and tympanic membrane normal.  Left Ear: Hearing and tympanic membrane normal.  Nose: Mucosal edema and sinus tenderness present. No nasal deformity. Right sinus exhibits frontal sinus tenderness. Left sinus exhibits frontal sinus tenderness.  Mouth/Throat: Posterior oropharyngeal erythema present.  Eyes: Pupils are equal, round,  and reactive to light. Conjunctivae and EOM are normal. Right eye exhibits no discharge. Left eye exhibits no discharge.  Neck: Normal range of motion. Neck supple.  Cardiovascular: Normal rate, regular rhythm and normal heart sounds.  Pulmonary/Chest: Effort normal. No respiratory distress. He has no decreased breath sounds. He has wheezes. He has no rhonchi. He has no rales.  Abdominal: Soft. Bowel sounds are normal.  Musculoskeletal: Normal range of motion.  Skin: Skin is warm and dry.    Results for orders placed or performed in visit on 09/20/17  CMP14+EGFR  Result Value Ref Range   Glucose 101 (H) 65 - 99 mg/dL   BUN  14 8 - 27 mg/dL   Creatinine, Ser 0.76 0.76 - 1.27 mg/dL   GFR calc non Af Amer 86 >59 mL/min/1.73   GFR calc Af Amer 100 >59 mL/min/1.73   BUN/Creatinine Ratio 18 10 - 24   Sodium 138 134 - 144 mmol/L   Potassium 4.1 3.5 - 5.2 mmol/L   Chloride 99 96 - 106 mmol/L   CO2 24 20 - 29 mmol/L   Calcium 8.8 8.6 - 10.2 mg/dL   Total Protein 7.3 6.0 - 8.5 g/dL   Albumin 4.4 3.5 - 4.7 g/dL   Globulin, Total 2.9 1.5 - 4.5 g/dL   Albumin/Globulin Ratio 1.5 1.2 - 2.2   Bilirubin Total 0.4 0.0 - 1.2 mg/dL   Alkaline Phosphatase 115 39 - 117 IU/L   AST 22 0 - 40 IU/L   ALT 29 0 - 44 IU/L  CBC with Differential/Platelet  Result Value Ref Range   WBC 5.9 3.4 - 10.8 x10E3/uL   RBC 5.15 4.14 - 5.80 x10E6/uL   Hemoglobin 15.1 13.0 - 17.7 g/dL   Hematocrit 46.4 37.5 - 51.0 %   MCV 90 79 - 97 fL   MCH 29.3 26.6 - 33.0 pg   MCHC 32.5 31.5 - 35.7 g/dL   RDW 13.9 12.3 - 15.4 %   Platelets 189 150 - 379 x10E3/uL   Neutrophils 42 Not Estab. %   Lymphs 48 Not Estab. %   Monocytes 8 Not Estab. %   Eos 1 Not Estab. %   Basos 1 Not Estab. %   Neutrophils Absolute 2.5 1.4 - 7.0 x10E3/uL   Lymphocytes Absolute 2.8 0.7 - 3.1 x10E3/uL   Monocytes Absolute 0.5 0.1 - 0.9 x10E3/uL   EOS (ABSOLUTE) 0.1 0.0 - 0.4 x10E3/uL   Basophils Absolute 0.0 0.0 - 0.2 x10E3/uL   Immature Granulocytes 0 Not Estab. %   Immature Grans (Abs) 0.0 0.0 - 0.1 x10E3/uL  Bayer DCA Hb A1c Waived  Result Value Ref Range   HB A1C (BAYER DCA - WAIVED) 5.4 <7.0 %      Assessment & Plan:   1. Rib pain - DG Ribs Unilateral W/Chest Left; Future  2. Closed fracture of multiple ribs of left side, initial encounter - DG Ribs Unilateral W/Chest Left; Future  3. Acute bronchitis, unspecified organism - cefdinir (OMNICEF) 300 MG capsule; Take 1 capsule (300 mg total) by mouth 2 (two) times daily. 1 po BID  Dispense: 20 capsule; Refill: 0   Continue all other maintenance medications as listed above.  Follow up plan: Return in  about 6 months (around 10/09/2018) for recheck and labs.  Educational handout given for [facility] [place] PA-C [place] [address]  [place], [place] [address] [phone]   04/11/2018, 10:25 PM

## 2018-06-13 ENCOUNTER — Other Ambulatory Visit: Payer: Self-pay | Admitting: *Deleted

## 2018-06-13 ENCOUNTER — Encounter: Payer: Self-pay | Admitting: Family Medicine

## 2018-06-13 ENCOUNTER — Ambulatory Visit (INDEPENDENT_AMBULATORY_CARE_PROVIDER_SITE_OTHER): Payer: Medicare HMO | Admitting: Family Medicine

## 2018-06-13 VITALS — BP 124/70 | HR 83 | Temp 98.1°F | Ht 71.0 in | Wt 232.0 lb

## 2018-06-13 DIAGNOSIS — I10 Essential (primary) hypertension: Secondary | ICD-10-CM

## 2018-06-13 DIAGNOSIS — M7022 Olecranon bursitis, left elbow: Secondary | ICD-10-CM | POA: Diagnosis not present

## 2018-06-13 MED ORDER — LISINOPRIL 10 MG PO TABS: ORAL_TABLET | ORAL | 1 refills | Status: DC

## 2018-06-13 MED ORDER — PAROXETINE HCL 20 MG PO TABS: 20.0000 mg | ORAL_TABLET | Freq: Every morning | ORAL | 1 refills | Status: DC

## 2018-06-13 NOTE — Progress Notes (Signed)
BP 124/70   Pulse 83   Temp 98.1 F (36.7 C) (Oral)   Ht '5\' 11"'  (1.803 m)   Wt 232 lb (105.2 kg)   BMI 32.36 kg/m    Subjective:    Patient ID: Nicholas Hawkins, male    DOB: 01/25/1936, 83 y.o.   MRN: [id number]  HPI: Nicholas Hawkins is a 83 y.o. male presenting on 06/13/2018 for Fluid on left elbow   HPI Swelling in left elbow Patient comes in with complaints of swelling in his left elbow is been bothering him over the past month.  He says that there has been getting larger over the past month as well.  He thinks he may have hit his elbow when his kit flipped while doing lumbar work and since then he has had that swelling in his left elbow.  He also fractured his ribs around the same time.  He says the elbow does not hurt him but he does catch it on stuff because it is so large and is concerned about that.  He denies any fevers or chills or redness or warmth.  Relevant past medical, surgical, family and social history reviewed and updated as indicated. Interim medical history since our last visit reviewed. Allergies and medications reviewed and updated.  Review of Systems  Constitutional: Negative for chills and fever.  Respiratory: Negative for shortness of breath and wheezing.   Cardiovascular: Negative for chest pain and leg swelling.  Musculoskeletal: Positive for joint swelling. Negative for arthralgias, back pain and gait problem.  Skin: Negative for rash.  All other systems reviewed and are negative.   Per HPI unless specifically indicated above   Allergies as of 06/13/2018   No Known Allergies     Medication List       Accurate as of June 13, 2018  5:26 PM. Always use your most recent med list.        cetirizine 10 MG tablet Commonly known as:  ZYRTEC TAKE ONE (1) TABLET EACH DAY   fluticasone 50 MCG/ACT nasal spray Commonly known as:  FLONASE Place 1 spray into both nostrils 2 (two) times daily as needed for allergies or rhinitis.     HYDROcodone-acetaminophen 5-325 MG tablet Commonly known as:  NORCO/VICODIN Take 1 tablet by mouth every 6 (six) hours as needed for moderate pain.   lisinopril 10 MG tablet Commonly known as:  PRINIVIL,ZESTRIL TAKE ONE (1) TABLET EACH DAY   PARoxetine 20 MG tablet Commonly known as:  PAXIL Take 1 tablet (20 mg total) by mouth every morning.          Objective:    BP 124/70   Pulse 83   Temp 98.1 F (36.7 C) (Oral)   Ht '5\' 11"'  (1.803 m)   Wt 232 lb (105.2 kg)   BMI 32.36 kg/m   Wt Readings from Last 3 Encounters:  06/13/18 232 lb (105.2 kg)  04/09/18 234 lb 9.6 oz (106.4 kg)  03/06/18 237 lb (107.5 kg)    Physical Exam Vitals signs and nursing note reviewed.  Constitutional:      General: He is not in acute distress.    Appearance: He is well-developed. He is not diaphoretic.  Eyes:     General: No scleral icterus.    Conjunctiva/sclera: Conjunctivae normal.  Neck:     Thyroid: No thyromegaly.  Musculoskeletal: Normal range of motion.     Left forearm: He exhibits swelling (Olecranon bursitis of left elbow). He exhibits no tenderness and  no bony tenderness.  Skin:    General: Skin is warm and dry.     Findings: No rash.  Neurological:     Mental Status: He is alert and oriented to person, place, and time.     Coordination: Coordination normal.  Psychiatric:        Behavior: Behavior normal.     Drainage of bursa: Used Betadine for cleansing and topical ethyl chloride for anesthesia.  Using a 3 mL syringe with a 22-gauge 1 inch needle was able to drain 5-1/2 cc of serosanguineous fluid from the left olecranon bursa and patient tolerated well with minimal bleeding.  Placed simple bandage over top and Ace wrap for compression.    Assessment & Plan:   Problem List Items Addressed This Visit    None    Visit Diagnoses    Olecranon bursitis of left elbow    -  Primary      Able to drain and recommended using Ace bandage for compression to prevent it from  returning. Follow up plan: Return if symptoms worsen or fail to improve.  Counseling provided for all of the vaccine components No orders of the defined types were placed in this encounter.   Caryl Pina, MD [facility] 06/13/2018, 5:26 PM

## 2018-06-18 DIAGNOSIS — M7632 Iliotibial band syndrome, left leg: Secondary | ICD-10-CM | POA: Diagnosis not present

## 2018-06-18 DIAGNOSIS — M170 Bilateral primary osteoarthritis of knee: Secondary | ICD-10-CM | POA: Diagnosis not present

## 2018-07-01 ENCOUNTER — Encounter: Payer: Medicare HMO | Admitting: *Deleted

## 2018-07-30 DIAGNOSIS — S86819A Strain of other muscle(s) and tendon(s) at lower leg level, unspecified leg, initial encounter: Secondary | ICD-10-CM | POA: Diagnosis not present

## 2018-07-30 DIAGNOSIS — M7632 Iliotibial band syndrome, left leg: Secondary | ICD-10-CM | POA: Insufficient documentation

## 2018-07-30 DIAGNOSIS — M170 Bilateral primary osteoarthritis of knee: Secondary | ICD-10-CM | POA: Diagnosis not present

## 2018-08-01 DIAGNOSIS — M7632 Iliotibial band syndrome, left leg: Secondary | ICD-10-CM | POA: Diagnosis not present

## 2018-08-01 DIAGNOSIS — S86819A Strain of other muscle(s) and tendon(s) at lower leg level, unspecified leg, initial encounter: Secondary | ICD-10-CM | POA: Diagnosis not present

## 2018-08-07 ENCOUNTER — Telehealth: Payer: Self-pay | Admitting: Physician Assistant

## 2018-08-07 NOTE — Telephone Encounter (Signed)
Pt denied awv

## 2018-08-07 NOTE — Telephone Encounter (Signed)
LM due for AWV °

## 2018-10-11 ENCOUNTER — Other Ambulatory Visit: Payer: Self-pay

## 2018-10-11 ENCOUNTER — Encounter: Payer: Self-pay | Admitting: Physician Assistant

## 2018-10-11 ENCOUNTER — Ambulatory Visit (INDEPENDENT_AMBULATORY_CARE_PROVIDER_SITE_OTHER): Payer: Medicare HMO | Admitting: Physician Assistant

## 2018-10-11 DIAGNOSIS — J0111 Acute recurrent frontal sinusitis: Secondary | ICD-10-CM | POA: Diagnosis not present

## 2018-10-11 DIAGNOSIS — J301 Allergic rhinitis due to pollen: Secondary | ICD-10-CM

## 2018-10-11 DIAGNOSIS — Z0000 Encounter for general adult medical examination without abnormal findings: Secondary | ICD-10-CM

## 2018-10-11 DIAGNOSIS — I10 Essential (primary) hypertension: Secondary | ICD-10-CM

## 2018-10-11 MED ORDER — PAROXETINE HCL 20 MG PO TABS: 20.0000 mg | ORAL_TABLET | Freq: Every morning | ORAL | 1 refills | Status: DC

## 2018-10-11 MED ORDER — CEFUROXIME AXETIL 250 MG PO TABS: 250.0000 mg | ORAL_TABLET | Freq: Two times a day (BID) | ORAL | 0 refills | Status: DC

## 2018-10-11 MED ORDER — FLUTICASONE PROPIONATE 50 MCG/ACT NA SUSP: 1.0000 | Freq: Two times a day (BID) | NASAL | 6 refills | Status: DC | PRN

## 2018-10-11 MED ORDER — CEFDINIR 300 MG PO CAPS: 300.0000 mg | ORAL_CAPSULE | Freq: Two times a day (BID) | ORAL | 0 refills | Status: DC

## 2018-10-11 MED ORDER — LISINOPRIL 10 MG PO TABS: ORAL_TABLET | ORAL | 1 refills | Status: DC

## 2018-10-11 NOTE — Progress Notes (Signed)
Telephone visit  Subjective: CC: Allergies, hypertension, recurrent sinus infections PCP: Terald Sleeper, PA-C Nicholas Hawkins is a 83 y.o. male calls for telephone consult today. Patient provides verbal consent for consult held via phone.  Patient is identified with 2 separate identifiers.  At this time the entire area is on COVID-19 social distancing and stay home orders are in place.  Patient is of higher risk and therefore we are performing this by a virtual method.  Location of patient: Home Location of provider: [facility] Others present for call: No  Is a periodic recheck for the patient's medical issues.  He does have allergic rhinitis that is chronic in nature.  He does get recurrent sinus infections.  When he took [place] last fall it did a very good job at clearing him up and he said fairly clear for a while.  He does feel like he is full again.  He is having a little bit of ringing in the ears and difficulty with hearing.  Otherwise he is doing well with his blood pressure and depression.  He does need refills on his medications.  There are no other issues at this time.   ROS: Per HPI  No Known Allergies Past Medical History:  Diagnosis Date  . Hyperlipidemia   . HYPERTENSION, MILD   . Low testosterone   . Obesity   . Obesity, unspecified     Current Outpatient Medications:  .  cefUROXime (CEFTIN) 250 MG tablet, Take 1 tablet (250 mg total) by mouth 2 (two) times daily with a meal., Disp: 28 tablet, Rfl: 0 .  cetirizine (ZYRTEC) 10 MG tablet, TAKE ONE (1) TABLET EACH DAY, Disp: 90 tablet, Rfl: 3 .  fluticasone (FLONASE) 50 MCG/ACT nasal spray, Place 1 spray into both nostrils 2 (two) times daily as needed for allergies or rhinitis., Disp: 16 g, Rfl: 6 .  HYDROcodone-acetaminophen (NORCO/VICODIN) 5-325 MG tablet, Take 1 tablet by mouth every 6 (six) hours as needed for moderate pain., Disp: 30 tablet, Rfl: 0 .  lisinopril (ZESTRIL) 10 MG tablet, TAKE ONE (1) TABLET  EACH DAY, Disp: 90 tablet, Rfl: 1 .  PARoxetine (PAXIL) 20 MG tablet, Take 1 tablet (20 mg total) by mouth every morning., Disp: 90 tablet, Rfl: 1  Assessment/ Plan: 83 y.o. male   1. Allergic rhinitis due to pollen, unspecified seasonality - fluticasone (FLONASE) 50 MCG/ACT nasal spray; Place 1 spray into both nostrils 2 (two) times daily as needed for allergies or rhinitis.  Dispense: 16 g; Refill: 6  2. HYPERTENSION, MILD - lisinopril (ZESTRIL) 10 MG tablet; TAKE ONE (1) TABLET EACH DAY  Dispense: 90 tablet; Refill: 1 - CBC with Differential/Platelet; Future - CMP14+EGFR; Future - Lipid panel; Future  3. Acute recurrent frontal sinusitis - cefUROXime (CEFTIN) 250 MG tablet; Take 1 tablet (250 mg total) by mouth 2 (two) times daily with a meal.  Dispense: 28 tablet; Refill: 0  4. Well adult exam - CBC with Differential/Platelet; Future - CMP14+EGFR; Future - Lipid panel; Future - PSA; Future   Start time: 8:40 AM End time: 8:48 AM  Meds ordered this encounter  Medications  . fluticasone (FLONASE) 50 MCG/ACT nasal spray    Sig: Place 1 spray into both nostrils 2 (two) times daily as needed for allergies or rhinitis.    Dispense:  16 g    Refill:  6    Order Specific Question:   Supervising Provider    Answer:   Janora Norlander [[id number]]  .  lisinopril (ZESTRIL) 10 MG tablet    Sig: TAKE ONE (1) TABLET EACH DAY    Dispense:  90 tablet    Refill:  1    Order Specific Question:   Supervising Provider    Answer:   Janora Norlander [[id number]]  . PARoxetine (PAXIL) 20 MG tablet    Sig: Take 1 tablet (20 mg total) by mouth every morning.    Dispense:  90 tablet    Refill:  1    Order Specific Question:   Supervising Provider    Answer:   Janora Norlander [[id number]]  . DISCONTD: cefdinir (OMNICEF) 300 MG capsule    Sig: Take 1 capsule (300 mg total) by mouth 2 (two) times daily.    Dispense:  28 capsule    Refill:  0    Order Specific Question:   Supervising  Provider    Answer:   Janora Norlander [[id number]]  . cefUROXime (CEFTIN) 250 MG tablet    Sig: Take 1 tablet (250 mg total) by mouth 2 (two) times daily with a meal.    Dispense:  28 tablet    Refill:  0    Please let patient know that the [place] was on back order.    Order Specific Question:   Supervising Provider    Answer:   Janora Norlander [[id number]]    Nicholas Nearing PA-C [place] [phone]

## 2019-01-01 DIAGNOSIS — S83282D Other tear of lateral meniscus, current injury, left knee, subsequent encounter: Secondary | ICD-10-CM | POA: Diagnosis not present

## 2019-01-01 DIAGNOSIS — M25562 Pain in left knee: Secondary | ICD-10-CM | POA: Diagnosis not present

## 2019-01-01 DIAGNOSIS — S83282A Other tear of lateral meniscus, current injury, left knee, initial encounter: Secondary | ICD-10-CM | POA: Insufficient documentation

## 2019-01-10 DIAGNOSIS — S83242A Other tear of medial meniscus, current injury, left knee, initial encounter: Secondary | ICD-10-CM | POA: Diagnosis not present

## 2019-01-10 DIAGNOSIS — M25562 Pain in left knee: Secondary | ICD-10-CM | POA: Diagnosis not present

## 2019-01-10 DIAGNOSIS — S83282D Other tear of lateral meniscus, current injury, left knee, subsequent encounter: Secondary | ICD-10-CM | POA: Diagnosis not present

## 2019-01-13 DIAGNOSIS — S83232A Complex tear of medial meniscus, current injury, left knee, initial encounter: Secondary | ICD-10-CM | POA: Diagnosis not present

## 2019-01-13 DIAGNOSIS — S83272A Complex tear of lateral meniscus, current injury, left knee, initial encounter: Secondary | ICD-10-CM | POA: Diagnosis not present

## 2019-01-20 ENCOUNTER — Telehealth: Payer: Self-pay | Admitting: Physician Assistant

## 2019-01-20 NOTE — Chronic Care Management (AMB) (Signed)
°  Chronic Care Management   Outreach Note  01/20/2019 Name: Nicholas Hawkins MRN: [id number] DOB: 08-18-1935  Referred by: Terald Sleeper, PA-C Reason for referral : Chronic Care Management (Third CCM outreach was unsuccessful)   Third unsuccessful telephone outreach was attempted today. The patient was referred to the case management team for assistance with chronic care management and care coordination. The patient's primary care provider has been notified of our unsuccessful attempts to make or maintain contact with the patient. The care management team is pleased to engage with this patient at any time in the future should he/she be interested in assistance from the care management team.   Follow Up Plan: The care management team is available to follow up with the patient after provider conversation with the patient regarding recommendation for care management engagement and subsequent re-referral to the care management team.   [place]  [email or web address]   ??[phone]

## 2019-02-19 IMAGING — DX DG CHEST 2V
2 series · 2 of 2 positions shown · non-contrast
Comparison: PA and lateral chest 05/13/2015.  CT chest 04/20/2015.

CLINICAL DATA: Fever for 3 days.  Altered mental status.

EXAM:
CHEST - 2 VIEW

[chest pa]
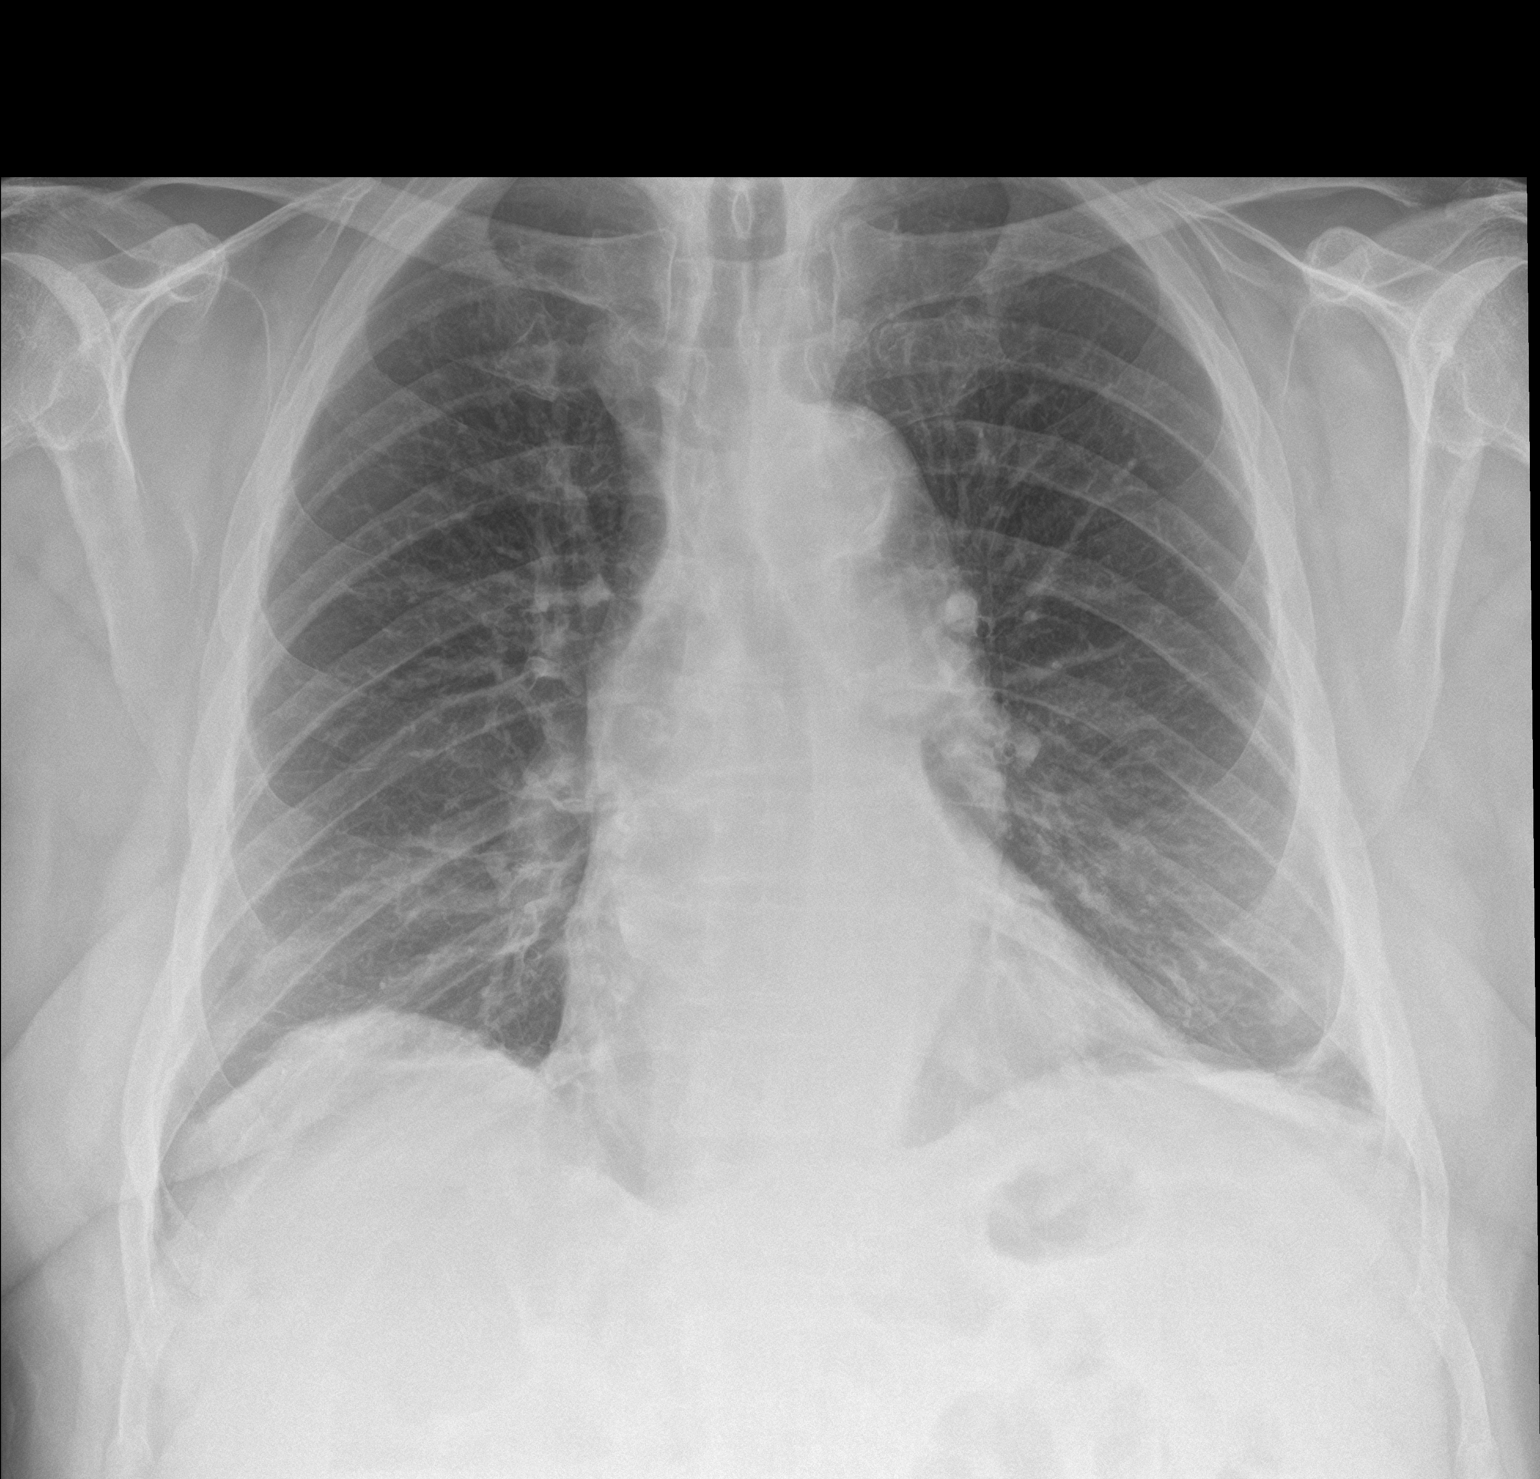

[chest lat]
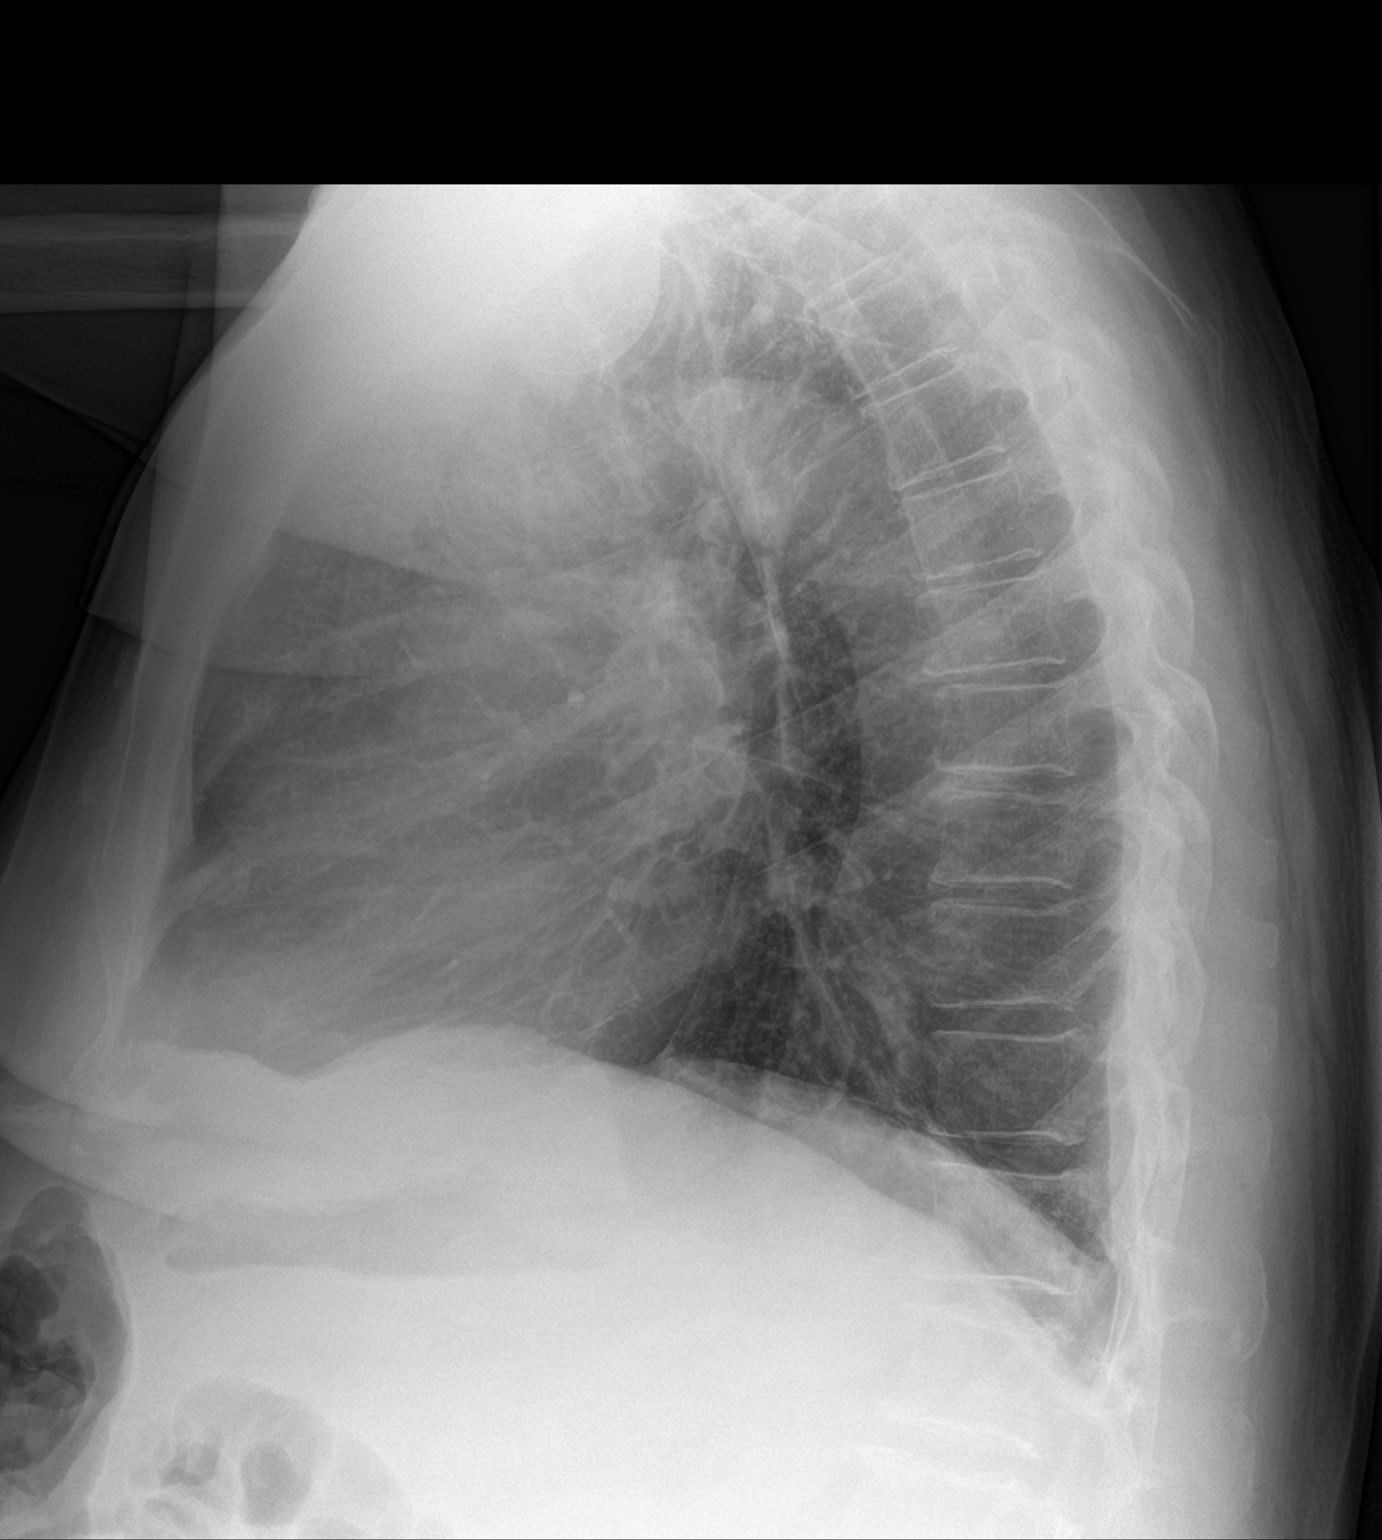

[2 of 2 positions shown; findings below may reference images not displayed]

FINDINGS: Mild bibasilar atelectasis is seen. The lungs are otherwise clear.
Heart size is normal. No pneumothorax or pleural fluid. Aortic
atherosclerosis is noted. No acute bony abnormality.
IMPRESSION: Mild bibasilar atelectasis.

Atherosclerosis.

## 2019-04-29 ENCOUNTER — Other Ambulatory Visit: Payer: Self-pay

## 2019-04-29 ENCOUNTER — Ambulatory Visit (INDEPENDENT_AMBULATORY_CARE_PROVIDER_SITE_OTHER): Payer: Medicare HMO | Admitting: Physician Assistant

## 2019-04-29 ENCOUNTER — Encounter: Payer: Self-pay | Admitting: Physician Assistant

## 2019-04-29 DIAGNOSIS — Z23 Encounter for immunization: Secondary | ICD-10-CM

## 2019-04-29 DIAGNOSIS — I10 Essential (primary) hypertension: Secondary | ICD-10-CM | POA: Diagnosis not present

## 2019-04-29 DIAGNOSIS — Z125 Encounter for screening for malignant neoplasm of prostate: Secondary | ICD-10-CM | POA: Diagnosis not present

## 2019-04-29 DIAGNOSIS — J301 Allergic rhinitis due to pollen: Secondary | ICD-10-CM | POA: Diagnosis not present

## 2019-04-29 DIAGNOSIS — Z0000 Encounter for general adult medical examination without abnormal findings: Secondary | ICD-10-CM

## 2019-04-29 MED ORDER — PAROXETINE HCL 20 MG PO TABS: 20.0000 mg | ORAL_TABLET | Freq: Every morning | ORAL | 3 refills | Status: DC

## 2019-04-29 MED ORDER — FLUTICASONE PROPIONATE 50 MCG/ACT NA SUSP: 1.0000 | Freq: Two times a day (BID) | NASAL | 11 refills | Status: DC | PRN

## 2019-04-29 MED ORDER — LISINOPRIL 10 MG PO TABS: ORAL_TABLET | ORAL | 3 refills | Status: DC

## 2019-04-30 LAB — CMP14+EGFR
ALT: 12 [IU]/L (ref 0–44)
AST: 16 [IU]/L (ref 0–40)
Albumin/Globulin Ratio: 2.4 — ABNORMAL HIGH (ref 1.2–2.2)
Albumin: 4.7 g/dL — ABNORMAL HIGH (ref 3.6–4.6)
Alkaline Phosphatase: 134 [IU]/L — ABNORMAL HIGH (ref 39–117)
BUN/Creatinine Ratio: 19 (ref 10–24)
BUN: 15 mg/dL (ref 8–27)
Bilirubin Total: 0.4 mg/dL (ref 0.0–1.2)
CO2: 24 mmol/L (ref 20–29)
Calcium: 8.8 mg/dL (ref 8.6–10.2)
Chloride: 100 mmol/L (ref 96–106)
Creatinine, Ser: 0.8 mg/dL (ref 0.76–1.27)
GFR calc Af Amer: 97 mL/min/{1.73_m2}
GFR calc non Af Amer: 84 mL/min/{1.73_m2}
Globulin, Total: 2 g/dL (ref 1.5–4.5)
Glucose: 87 mg/dL (ref 65–99)
Potassium: 4.1 mmol/L (ref 3.5–5.2)
Sodium: 140 mmol/L (ref 134–144)
Total Protein: 6.7 g/dL (ref 6.0–8.5)

## 2019-04-30 LAB — PSA: Prostate Specific Ag, Serum: 2.4 ng/mL (ref 0.0–4.0)

## 2019-04-30 LAB — CBC WITH DIFFERENTIAL/PLATELET
Basophils Absolute: 0 10*3/uL (ref 0.0–0.2)
Basos: 0 %
EOS (ABSOLUTE): 0.1 10*3/uL (ref 0.0–0.4)
Eos: 1 %
Hematocrit: 48 % (ref 37.5–51.0)
Hemoglobin: 15.7 g/dL (ref 13.0–17.7)
Immature Grans (Abs): 0 10*3/uL (ref 0.0–0.1)
Immature Granulocytes: 0 %
Lymphocytes Absolute: 1.7 10*3/uL (ref 0.7–3.1)
Lymphs: 37 %
MCH: 29.5 pg (ref 26.6–33.0)
MCHC: 32.7 g/dL (ref 31.5–35.7)
MCV: 90 fL (ref 79–97)
Monocytes Absolute: 0.4 10*3/uL (ref 0.1–0.9)
Monocytes: 10 %
Neutrophils Absolute: 2.4 10*3/uL (ref 1.4–7.0)
Neutrophils: 52 %
Platelets: 183 10*3/uL (ref 150–450)
RBC: 5.33 x10E6/uL (ref 4.14–5.80)
RDW: 13 % (ref 11.6–15.4)
WBC: 4.5 10*3/uL (ref 3.4–10.8)

## 2019-04-30 LAB — LIPID PANEL
Chol/HDL Ratio: 7.5 ratio — ABNORMAL HIGH (ref 0.0–5.0)
Cholesterol, Total: 179 mg/dL (ref 100–199)
HDL: 24 mg/dL — ABNORMAL LOW (ref >39)
LDL Chol Calc (NIH): 107 mg/dL — ABNORMAL HIGH (ref 0–99)
Triglycerides: 277 mg/dL — ABNORMAL HIGH (ref 0–149)
VLDL Cholesterol Cal: 48 mg/dL — ABNORMAL HIGH (ref 5–40)

## 2019-05-04 ENCOUNTER — Encounter: Payer: Self-pay | Admitting: Physician Assistant

## 2019-05-04 NOTE — Progress Notes (Signed)
BP 138/84   Pulse 70   Temp 98.9 F (37.2 C) (Temporal)   Ht '5\' 11"'  (1.803 m)   Wt 231 lb (104.8 kg)   SpO2 96%   BMI 32.22 kg/m    Subjective:    Patient ID: Nicholas Hawkins, male    DOB: [birth year], 83 y.o.   MRN: [id number]  HPI: Nicholas Hawkins is a 83 y.o. male presenting on 04/29/2019 for Hyperlipidemia  This patient comes in for periodic recheck on his chronic medical conditions which do include hyperlipidemia, hypertension.  He has also had problems in the past with allergies and low testosterone.  All of his medications are refilled he will have labs performed today.  He has no other complaints at this time.  Past Medical History:  Diagnosis Date  . Hyperlipidemia   . HYPERTENSION, MILD   . Low testosterone   . Obesity   . Obesity, unspecified    Relevant past medical, surgical, family and social history reviewed and updated as indicated. Interim medical history since our last visit reviewed. Allergies and medications reviewed and updated. DATA REVIEWED: CHART IN EPIC  Family History reviewed for pertinent findings.  Review of Systems  Constitutional: Negative.  Negative for appetite change and fatigue.  Eyes: Negative for pain and visual disturbance.  Respiratory: Negative.  Negative for cough, chest tightness, shortness of breath and wheezing.   Cardiovascular: Negative.  Negative for chest pain, palpitations and leg swelling.  Gastrointestinal: Negative.  Negative for abdominal pain, diarrhea, nausea and vomiting.  Genitourinary: Negative.   Skin: Negative.  Negative for color change and rash.  Neurological: Negative.  Negative for weakness, numbness and headaches.  Psychiatric/Behavioral: Negative.     Allergies as of 04/29/2019   No Known Allergies     Medication List       Accurate as of April 29, 2019 11:59 PM. If you have any questions, ask your nurse or doctor.        STOP taking these medications   cefUROXime 250 MG tablet Commonly  known as: CEFTIN Stopped by: Terald Sleeper, PA-C   HYDROcodone-acetaminophen 5-325 MG tablet Commonly known as: NORCO/VICODIN Stopped by: Terald Sleeper, PA-C     TAKE these medications   cetirizine 10 MG tablet Commonly known as: ZYRTEC TAKE ONE (1) TABLET EACH DAY   fluticasone 50 MCG/ACT nasal spray Commonly known as: FLONASE Place 1 spray into both nostrils 2 (two) times daily as needed for allergies or rhinitis.   lisinopril 10 MG tablet Commonly known as: ZESTRIL TAKE ONE (1) TABLET EACH DAY   PARoxetine 20 MG tablet Commonly known as: PAXIL Take 1 tablet (20 mg total) by mouth every morning.          Objective:    BP 138/84   Pulse 70   Temp 98.9 F (37.2 C) (Temporal)   Ht '5\' 11"'  (1.803 m)   Wt 231 lb (104.8 kg)   SpO2 96%   BMI 32.22 kg/m   No Known Allergies  Wt Readings from Last 3 Encounters:  04/29/19 231 lb (104.8 kg)  06/13/18 232 lb (105.2 kg)  04/09/18 234 lb 9.6 oz (106.4 kg)    Physical Exam Vitals signs and nursing note reviewed.  Constitutional:      General: He is not in acute distress.    Appearance: He is well-developed.  HENT:     Head: Normocephalic and atraumatic.  Eyes:     Conjunctiva/sclera: Conjunctivae normal.  Pupils: Pupils are equal, round, and reactive to light.  Cardiovascular:     Rate and Rhythm: Normal rate and regular rhythm.     Heart sounds: Normal heart sounds.  Pulmonary:     Effort: Pulmonary effort is normal. No respiratory distress.     Breath sounds: Normal breath sounds.  Skin:    General: Skin is warm and dry.  Psychiatric:        Behavior: Behavior normal.     Results for orders placed or performed in visit on 04/29/19  PSA  Result Value Ref Range   Prostate Specific Ag, Serum 2.4 0.0 - 4.0 ng/mL  Lipid panel  Result Value Ref Range   Cholesterol, Total 179 100 - 199 mg/dL   Triglycerides 277 (H) 0 - 149 mg/dL   HDL 24 (L) >39 mg/dL   VLDL Cholesterol Cal 48 (H) 5 - 40 mg/dL   LDL  Chol Calc (NIH) 107 (H) 0 - 99 mg/dL   Chol/HDL Ratio 7.5 (H) 0.0 - 5.0 ratio  CMP14+EGFR  Result Value Ref Range   Glucose 87 65 - 99 mg/dL   BUN 15 8 - 27 mg/dL   Creatinine, Ser 0.80 0.76 - 1.27 mg/dL   GFR calc non Af Amer 84 >59 mL/min/1.73   GFR calc Af Amer 97 >59 mL/min/1.73   BUN/Creatinine Ratio 19 10 - 24   Sodium 140 134 - 144 mmol/L   Potassium 4.1 3.5 - 5.2 mmol/L   Chloride 100 96 - 106 mmol/L   CO2 24 20 - 29 mmol/L   Calcium 8.8 8.6 - 10.2 mg/dL   Total Protein 6.7 6.0 - 8.5 g/dL   Albumin 4.7 (H) 3.6 - 4.6 g/dL   Globulin, Total 2.0 1.5 - 4.5 g/dL   Albumin/Globulin Ratio 2.4 (H) 1.2 - 2.2   Bilirubin Total 0.4 0.0 - 1.2 mg/dL   Alkaline Phosphatase 134 (H) 39 - 117 IU/L   AST 16 0 - 40 IU/L   ALT 12 0 - 44 IU/L  CBC with Differential/Platelet  Result Value Ref Range   WBC 4.5 3.4 - 10.8 x10E3/uL   RBC 5.33 4.14 - 5.80 x10E6/uL   Hemoglobin 15.7 13.0 - 17.7 g/dL   Hematocrit 48.0 37.5 - 51.0 %   MCV 90 79 - 97 fL   MCH 29.5 26.6 - 33.0 pg   MCHC 32.7 31.5 - 35.7 g/dL   RDW 13.0 11.6 - 15.4 %   Platelets 183 150 - 450 x10E3/uL   Neutrophils 52 Not Estab. %   Lymphs 37 Not Estab. %   Monocytes 10 Not Estab. %   Eos 1 Not Estab. %   Basos 0 Not Estab. %   Neutrophils Absolute 2.4 1.4 - 7.0 x10E3/uL   Lymphocytes Absolute 1.7 0.7 - 3.1 x10E3/uL   Monocytes Absolute 0.4 0.1 - 0.9 x10E3/uL   EOS (ABSOLUTE) 0.1 0.0 - 0.4 x10E3/uL   Basophils Absolute 0.0 0.0 - 0.2 x10E3/uL   Immature Granulocytes 0 Not Estab. %   Immature Grans (Abs) 0.0 0.0 - 0.1 x10E3/uL      Assessment & Plan:   1. HYPERTENSION, MILD - lisinopril (ZESTRIL) 10 MG tablet; TAKE ONE (1) TABLET EACH DAY  Dispense: 90 tablet; Refill: 3 - Lipid panel - CMP14+EGFR - CBC with Differential/Platelet  2. Allergic rhinitis due to pollen, unspecified seasonality - fluticasone (FLONASE) 50 MCG/ACT nasal spray; Place 1 spray into both nostrils 2 (two) times daily as needed for allergies or  rhinitis.  Dispense: 16 g; Refill: 11  3. Well adult exam - PSA - Lipid panel - CMP14+EGFR - CBC with Differential/Platelet  4. Need for immunization against influenza - Flu Vaccine QUAD High Dose(Fluad)   Continue all other maintenance medications as listed above.  Follow up plan: Return in about 6 months (around 10/27/2019).  Educational handout given for [place] PA-C [place] [address]  [place], [place] [address] [phone]   05/04/2019, 6:46 PM

## 2019-07-08 DIAGNOSIS — Z23 Encounter for immunization: Secondary | ICD-10-CM | POA: Diagnosis not present

## 2019-08-05 DIAGNOSIS — Z23 Encounter for immunization: Secondary | ICD-10-CM | POA: Diagnosis not present

## 2019-08-20 ENCOUNTER — Telehealth: Payer: Self-pay | Admitting: Physician Assistant

## 2019-08-20 NOTE — Chronic Care Management (AMB) (Signed)
  Chronic Care Management   Outreach Note  08/20/2019 Name: Nicholas Hawkins MRN: [id number] DOB: [birth year]  Nicholas Hawkins is a 84 y.o. year old male who is a primary care patient of Terald Sleeper, PA-C. I reached out to Colon Flattery by phone today in response to a referral sent by [name]'s health plan.     An unsuccessful telephone outreach was attempted today. The patient was referred to the case management team for assistance with care management and care coordination.   Follow Up Plan: A HIPPA compliant phone message was left for the patient providing contact information and requesting a return call. The care management team will reach out to the patient again over the next 7 days.  If patient returns call to provider office, please advise to call [place] at [phone].  [place], [place] [address] Direct Dial: [phone] [email or web address]

## 2019-08-27 NOTE — Chronic Care Management (AMB) (Signed)
  Chronic Care Management   Note  08/27/2019 Name: Nicholas Hawkins MRN: [id number] DOB: 10-18-1935  [name] is a 84 y.o. year old male who is a primary care patient of Terald Sleeper, PA-C. I reached out to Colon Flattery by phone today in response to a referral sent by Mr. Treyveon Mochizuki [employer]'s health plan.     Mr. Stager was given information about Chronic Care Management services today including:  1. CCM service includes personalized support from designated clinical staff supervised by his physician, including individualized plan of care and coordination with other care providers 2. 24/7 contact phone numbers for assistance for urgent and routine care needs. 3. Service will only be billed when office clinical staff spend 20 minutes or more in a month to coordinate care. 4. Only one practitioner may furnish and bill the service in a calendar month. 5. The patient may stop CCM services at any time (effective at the end of the month) by phone call to the office staff. 6. The patient will be responsible for cost sharing (co-pay) of up to 20% of the service fee (after annual deductible is met).  Patient did not agree to enrollment in care management services and does not wish to consider at this time.  Follow up plan: The care management team is available to follow up with the patient after provider conversation with the patient regarding recommendation for care management engagement and subsequent re-referral to the care management team.  The patient has been provided with contact information for the care management team and has been advised to call with any health related questions or concerns.   [place], [place] [address] Direct Dial: [phone] [email or web address]: [email or web address]

## 2019-11-25 DIAGNOSIS — L281 Prurigo nodularis: Secondary | ICD-10-CM | POA: Diagnosis not present

## 2019-11-25 DIAGNOSIS — L738 Other specified follicular disorders: Secondary | ICD-10-CM | POA: Diagnosis not present

## 2019-11-25 DIAGNOSIS — Z85828 Personal history of other malignant neoplasm of skin: Secondary | ICD-10-CM | POA: Diagnosis not present

## 2019-11-25 DIAGNOSIS — L663 Perifolliculitis capitis abscedens: Secondary | ICD-10-CM | POA: Diagnosis not present

## 2019-11-25 DIAGNOSIS — D485 Neoplasm of uncertain behavior of skin: Secondary | ICD-10-CM | POA: Diagnosis not present

## 2019-11-25 DIAGNOSIS — L570 Actinic keratosis: Secondary | ICD-10-CM | POA: Diagnosis not present

## 2019-12-31 DIAGNOSIS — H905 Unspecified sensorineural hearing loss: Secondary | ICD-10-CM | POA: Diagnosis not present

## 2020-01-12 ENCOUNTER — Other Ambulatory Visit: Payer: Self-pay

## 2020-01-12 ENCOUNTER — Encounter: Payer: Self-pay | Admitting: Family

## 2020-01-12 ENCOUNTER — Ambulatory Visit (INDEPENDENT_AMBULATORY_CARE_PROVIDER_SITE_OTHER): Payer: Medicare HMO | Admitting: Family

## 2020-01-12 VITALS — BP 126/81 | HR 68 | Temp 97.5°F | Ht 71.0 in | Wt 218.6 lb

## 2020-01-12 DIAGNOSIS — E6609 Other obesity due to excess calories: Secondary | ICD-10-CM | POA: Diagnosis not present

## 2020-01-12 DIAGNOSIS — Z6830 Body mass index (BMI) 30.0-30.9, adult: Secondary | ICD-10-CM | POA: Diagnosis not present

## 2020-01-12 DIAGNOSIS — I10 Essential (primary) hypertension: Secondary | ICD-10-CM

## 2020-01-12 DIAGNOSIS — Z0000 Encounter for general adult medical examination without abnormal findings: Secondary | ICD-10-CM | POA: Diagnosis not present

## 2020-01-12 DIAGNOSIS — M542 Cervicalgia: Secondary | ICD-10-CM

## 2020-01-12 DIAGNOSIS — R42 Dizziness and giddiness: Secondary | ICD-10-CM

## 2020-01-12 MED ORDER — MECLIZINE HCL 25 MG PO TABS: 25.0000 mg | ORAL_TABLET | Freq: Three times a day (TID) | ORAL | 2 refills | Status: DC | PRN

## 2020-01-12 NOTE — Patient Instructions (Signed)

## 2020-01-12 NOTE — Progress Notes (Addendum)
Subjective:    Patient ID: Nicholas Hawkins, male    DOB: 1935/10/17, 84 y.o.   MRN: [id number]  Chief Complaint  Patient presents with  . Neck Pain    When he bends over and lifts back up, with dizzy. Daughter wants regular labs today    PT presents to the office today for CPE. He had an episode of vertigo last week. He got over heated last week and became dizzy after bending down.   Neck Pain  This is a new problem. The current episode started more than 1 year ago. The problem occurs intermittently. The problem has been waxing and waning. The pain is present in the midline. The quality of the pain is described as aching. The pain is at a severity of 4/10. The pain is moderate. The symptoms are aggravated by bending. Pertinent negatives include no headaches.  Dizziness This is a recurrent problem. The current episode started 1 to 4 weeks ago. The problem occurs intermittently. The problem has been waxing and waning. Associated symptoms include congestion, neck pain and vertigo. Pertinent negatives include no headaches, nausea, sore throat or swollen glands. He has tried rest for the symptoms. The treatment provided mild relief.  Hypertension This is a chronic problem. The current episode started more than 1 year ago. The problem has been resolved since onset. Associated symptoms include anxiety and neck pain. Pertinent negatives include no headaches, peripheral edema or shortness of breath.  Anxiety Presents for follow-up visit. Symptoms include dizziness. Patient reports no excessive worry, irritability, nausea, panic, restlessness or shortness of breath. Symptoms occur most days. The severity of symptoms is moderate.      Family History  Problem Relation Age of Onset  . Heart disease Mother   . Early death Sister   . Aneurysm Brother    Social History   Socioeconomic History  . Marital status: Married    Spouse name: Not on file  . Number of children: 2  . Years of education: Not  on file  . Highest education level: 8th grade  Occupational History  . Not on file  Tobacco Use  . Smoking status: Never Smoker  . Smokeless tobacco: Never Used  Vaping Use  . Vaping Use: Never used  Substance and Sexual Activity  . Alcohol use: No  . Drug use: No  . Sexual activity: Never  Other Topics Concern  . Not on file  Social History Narrative  . Not on file   Social Determinants of Health   Financial Resource Strain:   . Difficulty of Paying Living Expenses:   Food Insecurity:   . Worried About Charity fundraiser in the Last Year:   . Arboriculturist in the Last Year:   Transportation Needs:   . Film/video editor (Medical):   Marland Kitchen Lack of Transportation (Non-Medical):   Physical Activity:   . Days of Exercise per Week:   . Minutes of Exercise per Session:   Stress:   . Feeling of Stress :   Social Connections:   . Frequency of Communication with Friends and Family:   . Frequency of Social Gatherings with Friends and Family:   . Attends Religious Services:   . Active Member of Clubs or Organizations:   . Attends Archivist Meetings:   Marland Kitchen Marital Status:      Review of Systems  Constitutional: Negative for irritability.  HENT: Positive for congestion. Negative for sore throat.   Respiratory: Negative for  shortness of breath.   Gastrointestinal: Negative for nausea.  Musculoskeletal: Positive for neck pain.  Neurological: Positive for dizziness and vertigo. Negative for headaches.  All other systems reviewed and are negative.      Objective:   Physical Exam Vitals reviewed.  Constitutional:      General: He is not in acute distress.    Appearance: He is well-developed. He is obese.  HENT:     Head: Normocephalic.     Right Ear: Tympanic membrane normal.     Left Ear: Tympanic membrane normal.  Eyes:     General:        Right eye: No discharge.        Left eye: No discharge.     Pupils: Pupils are equal, round, and reactive to  light.  Neck:     Thyroid: No thyromegaly.  Cardiovascular:     Rate and Rhythm: Normal rate and regular rhythm.     Heart sounds: Normal heart sounds. No murmur heard.   Pulmonary:     Effort: Pulmonary effort is normal. No respiratory distress.     Breath sounds: Normal breath sounds. No wheezing.  Abdominal:     General: Bowel sounds are normal. There is no distension.     Palpations: Abdomen is soft.     Tenderness: There is no abdominal tenderness.  Musculoskeletal:        General: No tenderness. Normal range of motion.     Cervical back: Normal range of motion and neck supple.  Skin:    General: Skin is warm and dry.     Findings: No erythema or rash.  Neurological:     Mental Status: He is alert and oriented to person, place, and time.     Cranial Nerves: No cranial nerve deficit.     Deep Tendon Reflexes: Reflexes are normal and symmetric.  Psychiatric:        Behavior: Behavior normal.        Thought Content: Thought content normal.        Judgment: Judgment normal.       BP 126/81   Pulse 68   Temp (!) 97.5 F (36.4 C) (Temporal)   Ht '5\' 11"'  (1.803 m)   Wt 218 lb 9.6 oz (99.2 kg)   SpO2 94%   BMI 30.49 kg/m      Assessment & Plan:  Nicholas Hawkins comes in today with chief complaint of Neck Pain (When he bends over and lifts back up, with dizzy. Daughter wants regular labs today )   Diagnosis and orders addressed:  1. HYPERTENSION, MILD - CMP14+EGFR - CBC with Differential/Platelet  2. NECK PAIN - CMP14+EGFR - CBC with Differential/Platelet  3. Class 1 obesity due to excess calories without serious comorbidity with body mass index (BMI) of 30.0 to 30.9 in adult - CMP14+EGFR - CBC with Differential/Platelet  4. Vertigo Rest Fall precautions discussed Does sound like patient become over heated, discussed forcing fluids - CMP14+EGFR - CBC with Differential/Platelet - TSH - meclizine (ANTIVERT) 25 MG tablet; Take 1 tablet (25 mg total) by  mouth 3 (three) times daily as needed for dizziness.  Dispense: 60 tablet; Refill: 2  5. Annual physical exam  - Lipid panel  Labs pending Health Maintenance reviewed Diet and exercise encouraged  Follow up plan: 6 months    Evelina Dun, FNP

## 2020-01-12 NOTE — Addendum Note (Signed)
Addended by: Evelina Dun A on: 01/12/2020 01:04 PM   Modules accepted: Orders

## 2020-01-13 LAB — LIPID PANEL
Chol/HDL Ratio: 8.4 ratio — ABNORMAL HIGH (ref 0.0–5.0)
Cholesterol, Total: 219 mg/dL — ABNORMAL HIGH (ref 100–199)
HDL: 26 mg/dL — ABNORMAL LOW
LDL Chol Calc (NIH): 149 mg/dL — ABNORMAL HIGH (ref 0–99)
Triglycerides: 238 mg/dL — ABNORMAL HIGH (ref 0–149)
VLDL Cholesterol Cal: 44 mg/dL — ABNORMAL HIGH (ref 5–40)

## 2020-01-13 LAB — CMP14+EGFR
ALT: 18 IU/L (ref 0–44)
AST: 16 IU/L (ref 0–40)
Albumin/Globulin Ratio: 2.1 (ref 1.2–2.2)
Albumin: 5 g/dL — ABNORMAL HIGH (ref 3.6–4.6)
Alkaline Phosphatase: 146 IU/L — ABNORMAL HIGH (ref 48–121)
BUN/Creatinine Ratio: 19 (ref 10–24)
BUN: 19 mg/dL (ref 8–27)
Bilirubin Total: 0.5 mg/dL (ref 0.0–1.2)
CO2: 28 mmol/L (ref 20–29)
Calcium: 9.8 mg/dL (ref 8.6–10.2)
Chloride: 100 mmol/L (ref 96–106)
Creatinine, Ser: 0.98 mg/dL (ref 0.76–1.27)
GFR calc Af Amer: 83 mL/min/1.73
GFR calc non Af Amer: 72 mL/min/1.73
Globulin, Total: 2.4 g/dL (ref 1.5–4.5)
Glucose: 89 mg/dL (ref 65–99)
Potassium: 4.7 mmol/L (ref 3.5–5.2)
Sodium: 138 mmol/L (ref 134–144)
Total Protein: 7.4 g/dL (ref 6.0–8.5)

## 2020-01-13 LAB — CBC WITH DIFFERENTIAL/PLATELET
Basophils Absolute: 0 x10E3/uL (ref 0.0–0.2)
Basos: 1 %
EOS (ABSOLUTE): 0.1 x10E3/uL (ref 0.0–0.4)
Eos: 1 %
Hematocrit: 49.8 % (ref 37.5–51.0)
Hemoglobin: 16.4 g/dL (ref 13.0–17.7)
Immature Grans (Abs): 0 x10E3/uL (ref 0.0–0.1)
Immature Granulocytes: 0 %
Lymphocytes Absolute: 1.9 x10E3/uL (ref 0.7–3.1)
Lymphs: 32 %
MCH: 29.7 pg (ref 26.6–33.0)
MCHC: 32.9 g/dL (ref 31.5–35.7)
MCV: 90 fL (ref 79–97)
Monocytes Absolute: 0.5 x10E3/uL (ref 0.1–0.9)
Monocytes: 9 %
Neutrophils Absolute: 3.4 x10E3/uL (ref 1.4–7.0)
Neutrophils: 57 %
Platelets: 206 x10E3/uL (ref 150–450)
RBC: 5.53 x10E6/uL (ref 4.14–5.80)
RDW: 12.9 % (ref 11.6–15.4)
WBC: 5.9 x10E3/uL (ref 3.4–10.8)

## 2020-01-13 LAB — TSH: TSH: 3 u[IU]/mL (ref 0.450–4.500)

## 2020-01-19 DIAGNOSIS — H905 Unspecified sensorineural hearing loss: Secondary | ICD-10-CM | POA: Diagnosis not present

## 2020-01-22 DIAGNOSIS — L570 Actinic keratosis: Secondary | ICD-10-CM | POA: Diagnosis not present

## 2020-01-22 DIAGNOSIS — Z85828 Personal history of other malignant neoplasm of skin: Secondary | ICD-10-CM | POA: Diagnosis not present

## 2020-01-22 DIAGNOSIS — L663 Perifolliculitis capitis abscedens: Secondary | ICD-10-CM | POA: Diagnosis not present

## 2020-03-30 ENCOUNTER — Other Ambulatory Visit (INDEPENDENT_AMBULATORY_CARE_PROVIDER_SITE_OTHER): Payer: Medicare HMO

## 2020-03-30 ENCOUNTER — Telehealth: Payer: Self-pay

## 2020-03-30 ENCOUNTER — Other Ambulatory Visit: Payer: Self-pay

## 2020-03-30 ENCOUNTER — Ambulatory Visit (INDEPENDENT_AMBULATORY_CARE_PROVIDER_SITE_OTHER): Payer: Medicare HMO

## 2020-03-30 DIAGNOSIS — Z23 Encounter for immunization: Secondary | ICD-10-CM

## 2020-03-30 DIAGNOSIS — M10479 Other secondary gout, unspecified ankle and foot: Secondary | ICD-10-CM

## 2020-03-30 DIAGNOSIS — Z8701 Personal history of pneumonia (recurrent): Secondary | ICD-10-CM

## 2020-03-30 MED ORDER — COLCHICINE 0.6 MG PO TABS: ORAL_TABLET | ORAL | 0 refills | Status: DC

## 2020-03-30 NOTE — Telephone Encounter (Signed)
Pt c/o gout flare in both feet. Started about one week. Pain with walking.   Pt has not had uric acid level in over one year. Last flare per pt was about 2-3 years ago.  He is requesting a medication to help. He is not sure what he has taken in the past.  Pt informed to schedule follow up appt with [facility] in Feb after receiving his flu shot today

## 2020-03-30 NOTE — Progress Notes (Signed)
Pt asked for his pneumonia vaccine to be given today during his flu vaccination appt.  Pt received prev 13 in 2015.  He was over the age of 78 at the time. Pneumovax 23 given today.

## 2020-04-28 ENCOUNTER — Ambulatory Visit: Payer: Medicare HMO

## 2020-05-19 ENCOUNTER — Ambulatory Visit: Payer: Medicare HMO

## 2020-06-18 ENCOUNTER — Other Ambulatory Visit: Payer: Self-pay | Admitting: *Deleted

## 2020-06-18 DIAGNOSIS — I10 Essential (primary) hypertension: Secondary | ICD-10-CM

## 2020-06-18 MED ORDER — LISINOPRIL 10 MG PO TABS: ORAL_TABLET | ORAL | 0 refills | Status: DC

## 2020-06-18 MED ORDER — PAROXETINE HCL 20 MG PO TABS: 20.0000 mg | ORAL_TABLET | Freq: Every morning | ORAL | 0 refills | Status: DC

## 2020-09-09 ENCOUNTER — Other Ambulatory Visit: Payer: Self-pay | Admitting: Family

## 2020-09-09 ENCOUNTER — Encounter: Payer: Self-pay | Admitting: Family

## 2020-09-09 ENCOUNTER — Other Ambulatory Visit: Payer: Self-pay

## 2020-09-09 ENCOUNTER — Ambulatory Visit (INDEPENDENT_AMBULATORY_CARE_PROVIDER_SITE_OTHER): Payer: Medicare HMO | Admitting: Family

## 2020-09-09 VITALS — BP 119/69 | HR 63 | Temp 97.8°F | Ht 71.0 in | Wt 216.2 lb

## 2020-09-09 DIAGNOSIS — E6609 Other obesity due to excess calories: Secondary | ICD-10-CM

## 2020-09-09 DIAGNOSIS — Z6830 Body mass index (BMI) 30.0-30.9, adult: Secondary | ICD-10-CM

## 2020-09-09 DIAGNOSIS — J301 Allergic rhinitis due to pollen: Secondary | ICD-10-CM

## 2020-09-09 DIAGNOSIS — F320 Major depressive disorder, single episode, mild: Secondary | ICD-10-CM | POA: Insufficient documentation

## 2020-09-09 DIAGNOSIS — R42 Dizziness and giddiness: Secondary | ICD-10-CM

## 2020-09-09 DIAGNOSIS — M10479 Other secondary gout, unspecified ankle and foot: Secondary | ICD-10-CM

## 2020-09-09 DIAGNOSIS — I10 Essential (primary) hypertension: Secondary | ICD-10-CM | POA: Diagnosis not present

## 2020-09-09 DIAGNOSIS — H1013 Acute atopic conjunctivitis, bilateral: Secondary | ICD-10-CM

## 2020-09-09 MED ORDER — MECLIZINE HCL 25 MG PO TABS
25.0000 mg | ORAL_TABLET | Freq: Three times a day (TID) | ORAL | 2 refills | Status: DC | PRN
Start: 2020-09-09 — End: 2021-07-26

## 2020-09-09 MED ORDER — FEXOFENADINE HCL 180 MG PO TABS: 180.0000 mg | ORAL_TABLET | Freq: Every day | ORAL | 1 refills | Status: DC

## 2020-09-09 MED ORDER — LISINOPRIL 10 MG PO TABS: ORAL_TABLET | ORAL | 0 refills | Status: DC

## 2020-09-09 MED ORDER — PAROXETINE HCL 20 MG PO TABS: 20.0000 mg | ORAL_TABLET | Freq: Every morning | ORAL | 0 refills | Status: DC

## 2020-09-09 MED ORDER — OLOPATADINE HCL 0.2 % OP SOLN: 1.0000 [drp] | Freq: Every day | OPHTHALMIC | 2 refills | Status: DC

## 2020-09-09 MED ORDER — COLCHICINE 0.6 MG PO TABS: ORAL_TABLET | ORAL | 0 refills | Status: DC

## 2020-09-09 NOTE — Progress Notes (Signed)
Subjective:    Patient ID: Nicholas Hawkins, male    DOB: Sep 10, 1935, 85 y.o.   MRN: [id number]  Chief Complaint  Patient presents with  . Medical Management of Chronic Issues  . Hypertension   Pt presents to the office today for chronic follow up.  Hypertension This is a chronic problem. The current episode started more than 1 year ago. The problem has been resolved since onset. The problem is controlled. Pertinent negatives include no malaise/fatigue, peripheral edema or shortness of breath. Risk factors for coronary artery disease include dyslipidemia, obesity and male gender. The current treatment provides moderate improvement. There is no history of CVA or heart failure.  Hyperlipidemia This is a chronic problem. The current episode started more than 1 year ago. The problem is uncontrolled. Exacerbating diseases include obesity. Pertinent negatives include no shortness of breath. Current antihyperlipidemic treatment includes diet change. The current treatment provides moderate improvement of lipids. Risk factors for coronary artery disease include dyslipidemia and male sex.  Conjunctivitis  The current episode started more than 2 weeks ago. The onset was gradual. The problem occurs occasionally. The problem is moderate. The symptoms are relieved by rest. Associated symptoms include eye redness. Pertinent negatives include no photophobia.  Dizziness This is a recurrent problem. The problem occurs intermittently. The problem has been waxing and waning.  Depression        This is a chronic problem.  The current episode started more than 1 year ago.   The onset quality is gradual.   Associated symptoms include no helplessness, no hopelessness, not irritable and no restlessness.  Past treatments include SSRIs - Selective serotonin reuptake inhibitors. Gout Pt takes colchicine as needed for gout. States it has been years since his last outbreak.     Review of Systems  Constitutional:  Negative for malaise/fatigue.  Eyes: Positive for redness. Negative for photophobia.  Respiratory: Negative for shortness of breath.   Neurological: Positive for dizziness.  Psychiatric/Behavioral: Positive for depression.  All other systems reviewed and are negative.      Objective:   Physical Exam Vitals reviewed.  Constitutional:      General: He is not irritable.He is not in acute distress.    Appearance: He is well-developed.  HENT:     Head: Normocephalic.     Right Ear: Tympanic membrane normal.     Left Ear: Tympanic membrane normal.  Eyes:     General:        Right eye: No discharge.        Left eye: No discharge.     Conjunctiva/sclera:     Right eye: Right conjunctiva is injected.     Left eye: Left conjunctiva is injected.     Pupils: Pupils are equal, round, and reactive to light.  Neck:     Thyroid: No thyromegaly.  Cardiovascular:     Rate and Rhythm: Normal rate and regular rhythm.     Heart sounds: Normal heart sounds. No murmur heard.   Pulmonary:     Effort: Pulmonary effort is normal. No respiratory distress.     Breath sounds: Normal breath sounds. No wheezing.  Abdominal:     General: Bowel sounds are normal. There is no distension.     Palpations: Abdomen is soft.     Tenderness: There is no abdominal tenderness.  Musculoskeletal:        General: No tenderness. Normal range of motion.     Cervical back: Normal range of motion and neck  supple.  Skin:    General: Skin is warm and dry.     Findings: No erythema or rash.  Neurological:     Mental Status: He is alert and oriented to person, place, and time.     Cranial Nerves: No cranial nerve deficit.     Deep Tendon Reflexes: Reflexes are normal and symmetric.  Psychiatric:        Behavior: Behavior normal.        Thought Content: Thought content normal.        Judgment: Judgment normal.       BP 119/69   Pulse 63   Temp 97.8 F (36.6 C) (Temporal)   Ht _0  (1.803 m)   Wt 216 lb  3.2 oz (98.1 kg)   BMI 30.15 kg/m      Assessment & Plan:  Raihan Kimmel comes in today with chief complaint of Medical Management of Chronic Issues and Hypertension   Diagnosis and orders addressed:  1. Allergic conjunctivitis of both eyes -Start patday - CMP14+EGFR - CBC with Differential/Platelet - Olopatadine HCl (PATADAY) 0.2 % SOLN; Apply 1 drop to eye daily.  Dispense: 2.5 mL; Refill: 2 - fexofenadine (ALLEGRA ALLERGY) 180 MG tablet; Take 1 tablet (180 mg total) by mouth daily.  Dispense: 90 tablet; Refill: 1  2. HYPERTENSION, MILD - lisinopril (ZESTRIL) 10 MG tablet; TAKE ONE (1) TABLET EACH DAY  Dispense: 90 tablet; Refill: 0 - CMP14+EGFR - CBC with Differential/Platelet - fexofenadine (ALLEGRA ALLERGY) 180 MG tablet; Take 1 tablet (180 mg total) by mouth daily.  Dispense: 90 tablet; Refill: 1  3. Other secondary acute gout of foot, unspecified laterality - colchicine 0.6 MG tablet; 2 tablets at onset of pain , may repeat 1 tablet in 1 hour if still hurting, but no more in 24 hours.  Dispense: 20 tablet; Refill: 0 - CMP14+EGFR - CBC with Differential/Platelet  4. Vertigo - meclizine (ANTIVERT) 25 MG tablet; Take 1 tablet (25 mg total) by mouth 3 (three) times daily as needed for dizziness.  Dispense: 60 tablet; Refill: 2 - CMP14+EGFR - CBC with Differential/Platelet  5. Allergic rhinitis due to pollen, unspecified seasonality - CMP14+EGFR - CBC with Differential/Platelet  6. Class 1 obesity due to excess calories without serious comorbidity with body mass index (BMI) of 30.0 to 30.9 in adult  7. Depression, major, single episode, mild ([place])   Labs pending Health Maintenance reviewed Diet and exercise encouraged  Follow up plan: 6 months    Evelina Dun, FNP

## 2020-09-09 NOTE — Patient Instructions (Signed)
Allergic Conjunctivitis, Adult Allergic conjunctivitis is inflammation of the conjunctiva. The conjunctiva is the thin, clear membrane that covers the white part of the eye and the inner surface of the eyelid. In this condition:  The blood vessels in the conjunctiva become irritated and swell.  The eyes become red or pink and feel itchy. Allergic conjunctivitis cannot be spread from person to person. This condition can develop at any age and may be outgrown. What are the causes? This condition is caused by allergens. These are things that can cause an allergic reaction in some people but not in other people. Common allergens include:  Outdoor allergens, such as: ? Pollen, including pollen from grass and weeds. ? Mold spores. ? Car fumes.  Indoor allergens, such as: ? Dust. ? Smoke. ? Mold spores. ? Proteins in a pet's urine, saliva, or dander. What increases the risk? You may be more likely to develop this condition if you have a family history of these things:  Allergies.  Conditions caused by being exposed to allergens, such as: ? Allergic rhinitis. This is an allergic reaction that affects the nose. ? Bronchial asthma. This condition affects the large airways in the lungs and makes breathing difficult. ? Atopic dermatitis (eczema). This is inflammation of the skin that is long-term (chronic). What are the signs or symptoms? Symptoms of this condition include eyes that are:  Itchy.  Red.  Watery.  Puffy. Your eyes may also:  Sting or burn.  Have clear fluid draining from them.  Have thick mucus discharge and pain (vernal conjunctivitis). How is this diagnosed? This condition may be diagnosed by:  Your medical history.  A physical exam.  Tests of the fluid draining from your eyes to rule out other causes.  Other tests to confirm the diagnosis, including: ? Testing for allergies. The skin may be pricked with a tiny needle. The pricked area is then exposed to  small amounts of allergens. ? Testing for other eye conditions. Tests may include:  Blood tests.  Tissue scrapings from your eyelid. The tissue is then checked under a microscope. How is this treated? This condition may be treated with:  Cold, wet cloths (cold compresses) to soothe itching and swelling.  Washing the face to remove allergens.  Eye drops. These may be prescription or over-the-counter. You may need to try different types to see which one works best for you, such as: ? Eye drops that block the allergic reaction (antihistamine). ? Eye drops that reduce swelling and irritation (anti-inflammatory). ? Steroid eye drops, which may be given if other treatments have not worked (vernal conjunctivitis).  Oral antihistamine medicines. These are medicines taken by mouth to lessen your allergic reaction. You may need these if eye drops do not help or are difficult to use.   Follow these instructions at home: Eye care  Apply a clean, cold compress to your eyes for 10-20 minutes, 3-4 times a day.  Do not touch or rub your eyes.  Do not wear contact lenses until the inflammation is gone. Wear glasses instead.  Do not wear eye makeup until the inflammation is gone. General instructions  Avoid known allergens whenever possible.  Take or apply over-the-counter and prescription medicines only as told by your health care provider. These include any eye drops.  Drink enough fluid to keep your urine pale yellow.  Keep all follow-up visits as told by your health care provider. This is important. Contact a health care provider if:  Your symptoms get worse  or do not get better with treatment.  You have mild eye pain.  You become sensitive to light.  You have spots or blisters on your eyes.  You have pus draining from your eyes.  You have a fever. Get help right away if:  You have redness, swelling, or other symptoms in only one eye.  Your vision is blurred or you have other  vision changes.  You have severe eye pain. Summary  Allergic conjunctivitis is inflammation of the clear membrane that covers the white part of the eye and the inner surface of the eyelid.  Take or apply over-the-counter and prescription medicines only as told by your health care provider. These include eye drops.  Do not touch or rub your eyes.  Contact a health care provider if your symptoms get worse or do not get better with treatment. This information is not intended to replace advice given to you by your health care provider. Make sure you discuss any questions you have with your health care provider. Document Revised: 04/21/2019 Document Reviewed: 04/21/2019 Elsevier Patient Education  2021 [employer].

## 2020-09-10 LAB — CBC WITH DIFFERENTIAL/PLATELET
Basophils Absolute: 0 10*3/uL (ref 0.0–0.2)
Basos: 1 %
EOS (ABSOLUTE): 0.1 10*3/uL (ref 0.0–0.4)
Eos: 1 %
Hematocrit: 45.8 % (ref 37.5–51.0)
Hemoglobin: 15.7 g/dL (ref 13.0–17.7)
Immature Grans (Abs): 0 10*3/uL (ref 0.0–0.1)
Immature Granulocytes: 0 %
Lymphocytes Absolute: 1.5 10*3/uL (ref 0.7–3.1)
Lymphs: 29 %
MCH: 30.7 pg (ref 26.6–33.0)
MCHC: 34.3 g/dL (ref 31.5–35.7)
MCV: 90 fL (ref 79–97)
Monocytes Absolute: 0.5 10*3/uL (ref 0.1–0.9)
Monocytes: 9 %
Neutrophils Absolute: 3.1 10*3/uL (ref 1.4–7.0)
Neutrophils: 60 %
Platelets: 195 10*3/uL (ref 150–450)
RBC: 5.12 x10E6/uL (ref 4.14–5.80)
RDW: 12.9 % (ref 11.6–15.4)
WBC: 5.1 10*3/uL (ref 3.4–10.8)

## 2020-09-10 LAB — CMP14+EGFR
ALT: 15 IU/L (ref 0–44)
AST: 17 IU/L (ref 0–40)
Albumin/Globulin Ratio: 1.8 (ref 1.2–2.2)
Albumin: 4.6 g/dL (ref 3.6–4.6)
Alkaline Phosphatase: 145 IU/L — ABNORMAL HIGH (ref 44–121)
BUN/Creatinine Ratio: 17 (ref 10–24)
BUN: 16 mg/dL (ref 8–27)
Bilirubin Total: 0.6 mg/dL (ref 0.0–1.2)
CO2: 25 mmol/L (ref 20–29)
Calcium: 9.3 mg/dL (ref 8.6–10.2)
Chloride: 101 mmol/L (ref 96–106)
Creatinine, Ser: 0.96 mg/dL (ref 0.76–1.27)
Globulin, Total: 2.6 g/dL (ref 1.5–4.5)
Glucose: 111 mg/dL — ABNORMAL HIGH (ref 65–99)
Potassium: 4.6 mmol/L (ref 3.5–5.2)
Sodium: 142 mmol/L (ref 134–144)
Total Protein: 7.2 g/dL (ref 6.0–8.5)
eGFR: 79 mL/min/{1.73_m2} (ref >59)

## 2021-01-05 ENCOUNTER — Other Ambulatory Visit: Payer: Self-pay | Admitting: Family

## 2021-01-05 DIAGNOSIS — I10 Essential (primary) hypertension: Secondary | ICD-10-CM

## 2021-03-09 DIAGNOSIS — D485 Neoplasm of uncertain behavior of skin: Secondary | ICD-10-CM | POA: Diagnosis not present

## 2021-03-09 DIAGNOSIS — L280 Lichen simplex chronicus: Secondary | ICD-10-CM | POA: Diagnosis not present

## 2021-03-09 DIAGNOSIS — L570 Actinic keratosis: Secondary | ICD-10-CM | POA: Diagnosis not present

## 2021-03-18 ENCOUNTER — Encounter: Payer: Self-pay | Admitting: Family

## 2021-03-18 ENCOUNTER — Other Ambulatory Visit: Payer: Self-pay

## 2021-03-18 ENCOUNTER — Ambulatory Visit (INDEPENDENT_AMBULATORY_CARE_PROVIDER_SITE_OTHER): Payer: Medicare HMO | Admitting: Family

## 2021-03-18 VITALS — BP 146/81 | HR 60 | Temp 97.8°F | Ht 71.0 in | Wt 210.0 lb

## 2021-03-18 DIAGNOSIS — F320 Major depressive disorder, single episode, mild: Secondary | ICD-10-CM

## 2021-03-18 DIAGNOSIS — M542 Cervicalgia: Secondary | ICD-10-CM

## 2021-03-18 DIAGNOSIS — R42 Dizziness and giddiness: Secondary | ICD-10-CM | POA: Diagnosis not present

## 2021-03-18 MED ORDER — DICLOFENAC SODIUM 75 MG PO TBEC: 75.0000 mg | DELAYED_RELEASE_TABLET | Freq: Two times a day (BID) | ORAL | 0 refills | Status: DC

## 2021-03-18 MED ORDER — PREDNISONE 10 MG (21) PO TBPK: ORAL_TABLET | ORAL | 0 refills | Status: DC

## 2021-03-18 MED ORDER — PAROXETINE HCL 40 MG PO TABS: 40.0000 mg | ORAL_TABLET | ORAL | 1 refills | Status: DC

## 2021-03-18 NOTE — Patient Instructions (Signed)
Vertigo Vertigo is the feeling that you or your surroundings are moving when they are not. This feeling can come and go at any time. Vertigo often goes away on its own. Vertigo can be dangerous if it occurs while you are doing something thatcould endanger yourself or others, such as driving or operating machinery. Your health care provider will do tests to try to determine the cause of your vertigo. Tests will also help your health care provider decide how best totreat your condition. Follow these instructions at home: Eating and drinking     Dehydration can make vertigo worse. Drink enough fluid to keep your urine pale yellow. Do not drink alcohol. Activity Return to your normal activities as told by your health care provider. Ask your health care provider what activities are safe for you. In the morning, first sit up on the side of the bed. When you feel okay, stand slowly while you hold onto something until you know that your balance is fine. Move slowly. Avoid sudden body or head movements or certain positions, as told by your health care provider. If you have trouble walking or keeping your balance, try using a cane for stability. If you feel dizzy or unstable, sit down right away. Avoid doing any tasks that would cause danger to you or others if vertigo occurs. Avoid bending down if you feel dizzy. Place items in your home so that they are easy for you to reach without bending or leaning over. Do not drive or use machinery if you feel dizzy. General instructions Take over-the-counter and prescription medicines only as told by your health care provider. Keep all follow-up visits. This is important. Contact a health care provider if: Your medicines do not relieve your vertigo or they make it worse. Your condition gets worse or you develop new symptoms. You have a fever. You develop nausea or vomiting, or if nausea gets worse. Your family or friends notice any behavioral changes. You  have numbness or a prickling and tingling sensation in part of your body. Get help right away if you: Are always dizzy or you faint. Develop severe headaches. Develop a stiff neck. Develop sensitivity to light. Have difficulty moving or speaking. Have weakness in your hands, arms, or legs. Have changes in your hearing or vision. These symptoms may represent a serious problem that is an emergency. Do not wait to see if the symptoms will go away. Get medical help right away. Call your local emergency services (911 in the [place] Do not drive yourself to the hospital. Summary Vertigo is the feeling that you or your surroundings are moving when they are not. Your health care provider will do tests to try to determine the cause of your vertigo. Follow instructions for home care. You may be told to avoid certain tasks, positions, or movements. Contact a health care provider if your medicines do not relieve your symptoms, or if you have a fever, nausea, vomiting, or changes in behavior. Get help right away if you have severe headaches or difficulty speaking, or you develop hearing or vision problems. This information is not intended to replace advice given to you by your health care provider. Make sure you discuss any questions you have with your healthcare provider. Document Revised: 04/28/2020 Document Reviewed: 04/28/2020 Elsevier Patient Education  2022 [employer]  

## 2021-03-18 NOTE — Progress Notes (Signed)
Subjective:    Patient ID: Nicholas Hawkins, male    DOB: 07-May-1936, 85 y.o.   MRN: [id number]  Chief Complaint  Patient presents with   neck problem    Numbness runs down both sides of neck x 2 weeks    Dizziness    Dizziness This is a new problem. The current episode started in the past 7 days. The problem has been waxing and waning. Associated symptoms include neck pain. Pertinent negatives include no weakness. The symptoms are aggravated by bending. He has tried rest for the symptoms. The treatment provided mild relief.  Neck Pain  This is a new problem. The current episode started 1 to 4 weeks ago. The problem occurs intermittently. The problem has been waxing and waning. The pain is associated with nothing. Pain scale: tingling pain. The pain is mild. The symptoms are aggravated by twisting. Associated symptoms include tingling. Pertinent negatives include no photophobia, syncope, trouble swallowing, weakness or weight loss.  Depression        This is a chronic problem.  The current episode started more than 1 year ago.   The onset quality is gradual.   The problem occurs intermittently.  Associated symptoms include helplessness, hopelessness, irritable, restlessness and sad.  Past treatments include SSRIs - Selective serotonin reuptake inhibitors.  Compliance with treatment is good.    Review of Systems  Constitutional:  Negative for weight loss.  HENT:  Negative for trouble swallowing.   Eyes:  Negative for photophobia.  Cardiovascular:  Negative for syncope.  Musculoskeletal:  Positive for neck pain.  Neurological:  Positive for dizziness and tingling. Negative for weakness.  Psychiatric/Behavioral:  Positive for depression.   All other systems reviewed and are negative.     Objective:   Physical Exam Vitals reviewed.  Constitutional:      General: He is irritable. He is not in acute distress.    Appearance: He is well-developed. He is obese.  HENT:     Head:  Normocephalic.     Right Ear: Tympanic membrane normal.     Left Ear: Tympanic membrane normal.  Eyes:     General:        Right eye: No discharge.        Left eye: No discharge.     Pupils: Pupils are equal, round, and reactive to light.  Neck:     Thyroid: No thyromegaly.  Cardiovascular:     Rate and Rhythm: Normal rate and regular rhythm.     Heart sounds: Normal heart sounds. No murmur heard. Pulmonary:     Effort: Pulmonary effort is normal. No respiratory distress.     Breath sounds: Normal breath sounds. No wheezing.  Abdominal:     General: Bowel sounds are normal. There is no distension.     Palpations: Abdomen is soft.     Tenderness: There is no abdominal tenderness.  Musculoskeletal:        General: No tenderness. Normal range of motion.     Cervical back: Normal range of motion and neck supple.     Comments: Pain in posterior neck with flexion, ROM of neck with rotation  Skin:    General: Skin is warm and dry.     Findings: No erythema or rash.  Neurological:     Mental Status: He is alert and oriented to person, place, and time.     Cranial Nerves: No cranial nerve deficit.     Deep Tendon Reflexes: Reflexes are normal and  symmetric.  Psychiatric:        Behavior: Behavior normal.        Thought Content: Thought content normal.        Judgment: Judgment normal.    BP (!) 146/81   Pulse 60   Temp 97.8 F (36.6 C) (Temporal)   Ht 5\' 11"  (1.803 m)   Wt 210 lb (95.3 kg)   BMI 29.29 kg/m        Assessment & Plan:  Nicholas Hawkins comes in today with chief complaint of neck problem (Numbness runs down both sides of neck x 2 weeks ) and Dizziness   Diagnosis and orders addressed:  1. Neck pain Rest Diclofenac BID with food No other NSAID's while taking diclofenac  ROM exercises encouraged - diclofenac (VOLTAREN) 75 MG EC tablet; Take 1 tablet (75 mg total) by mouth 2 (two) times daily.  Dispense: 30 tablet; Refill: 0 - predniSONE (STERAPRED UNI-PAK  21 TAB) 10 MG (21) TBPK tablet; Use as directed  Dispense: 21 tablet; Refill: 0  2. Vertigo Avoid fast position changes   3. Depression, major, single episode, mild (HCC) Will increase Paxil to 40 mg from 20 mg  Stress management discussed  - PARoxetine (PAXIL) 40 MG tablet; Take 1 tablet (40 mg total) by mouth every morning.  Dispense: 90 tablet; Refill: [place], FNP

## 2021-03-28 ENCOUNTER — Other Ambulatory Visit: Payer: Self-pay | Admitting: Family

## 2021-03-28 DIAGNOSIS — I10 Essential (primary) hypertension: Secondary | ICD-10-CM

## 2021-04-15 ENCOUNTER — Ambulatory Visit: Payer: Medicare HMO

## 2021-04-20 ENCOUNTER — Telehealth: Payer: Self-pay | Admitting: Family

## 2021-04-20 NOTE — Telephone Encounter (Signed)
Left message for patient to call back and schedule Medicare Annual Wellness Visit (AWV) to be completed by video or phone.   Last AWV: 05/09/2017  Please schedule at anytime with [facility] Health Advisor.  45 minute appointment  Any questions, please contact me at [phone]

## 2021-05-02 ENCOUNTER — Other Ambulatory Visit: Payer: Self-pay

## 2021-05-02 ENCOUNTER — Ambulatory Visit (INDEPENDENT_AMBULATORY_CARE_PROVIDER_SITE_OTHER): Payer: Medicare HMO

## 2021-05-02 DIAGNOSIS — Z23 Encounter for immunization: Secondary | ICD-10-CM | POA: Diagnosis not present

## 2021-06-01 ENCOUNTER — Telehealth: Payer: Self-pay | Admitting: Family

## 2021-06-01 NOTE — Telephone Encounter (Signed)
Left message for patient to call back and schedule Medicare Annual Wellness Visit (AWV) to be completed by video or phone.   Last AWV: 05/09/2017  Please schedule at anytime with [place]  45 minute appointment  Any questions, please contact me at [phone]

## 2021-06-14 DIAGNOSIS — D485 Neoplasm of uncertain behavior of skin: Secondary | ICD-10-CM | POA: Diagnosis not present

## 2021-06-14 DIAGNOSIS — L280 Lichen simplex chronicus: Secondary | ICD-10-CM | POA: Diagnosis not present

## 2021-06-14 DIAGNOSIS — B078 Other viral warts: Secondary | ICD-10-CM | POA: Diagnosis not present

## 2021-06-20 ENCOUNTER — Encounter: Payer: Self-pay | Admitting: Nurse Practitioner

## 2021-06-20 ENCOUNTER — Ambulatory Visit (INDEPENDENT_AMBULATORY_CARE_PROVIDER_SITE_OTHER): Payer: Medicare HMO | Admitting: Nurse Practitioner

## 2021-06-20 DIAGNOSIS — U071 COVID-19: Secondary | ICD-10-CM | POA: Diagnosis not present

## 2021-06-20 MED ORDER — GUAIFENESIN ER 600 MG PO TB12: 600.0000 mg | ORAL_TABLET | Freq: Two times a day (BID) | ORAL | 0 refills | Status: DC

## 2021-06-20 MED ORDER — NIRMATRELVIR/RITONAVIR (PAXLOVID)TABLET: 3.0000 | ORAL_TABLET | Freq: Two times a day (BID) | ORAL | 0 refills | Status: AC

## 2021-06-20 NOTE — Patient Instructions (Signed)
10 Things You Can Do to Manage Your COVID-19 Symptoms at Home ?If you have possible or confirmed COVID-19 ?Stay home except to get medical care. ?Monitor your symptoms carefully. If your symptoms get worse, call your healthcare provider immediately. ?Get rest and stay hydrated. ?If you have a medical appointment, call the healthcare provider ahead of time and tell them that you have or may have COVID-19. ?For medical emergencies, call 911 and notify the dispatch personnel that you have or may have COVID-19. ?Cover your cough and sneezes with a tissue or use the inside of your elbow. ?Wash your hands often with soap and water for at least 20 seconds or clean your hands with an alcohol-based hand sanitizer that contains at least 60% alcohol. ?As much as possible, stay in a specific room and away from other people in your home. Also, you should use a separate bathroom, if available. If you need to be around other people in or outside of the home, wear a mask. ?Avoid sharing personal items with other people in your household, like dishes, towels, and bedding. ?Clean all surfaces that are touched often, like counters, tabletops, and doorknobs. Use household cleaning sprays or wipes according to the label instructions. ?cdc.gov/coronavirus ?12/26/2019 ?This information is not intended to replace advice given to you by your health care provider. Make sure you discuss any questions you have with your health care provider. ?Document Revised: 02/18/2021 Document Reviewed: 02/18/2021 ?Elsevier Patient Education ? 2022 Elsevier Inc. ? ?

## 2021-06-20 NOTE — Progress Notes (Signed)
° °  Virtual Visit  Note Due to COVID-19 pandemic this visit was conducted virtually. This visit type was conducted due to national recommendations for restrictions regarding the COVID-19 Pandemic (e.g. social distancing, sheltering in place) in an effort to limit this patient's exposure and mitigate transmission in our community. All issues noted in this document were discussed and addressed.  A physical exam was not performed with this format.  I connected with Nicholas Hawkins on 06/20/21 at 11:20 Am  by telephone and verified that I am speaking with the correct person using two identifiers. Nicholas Hawkins is currently located at home during visit. The provider, Ivy Lynn, NP is located in their office at time of visit.  I discussed the limitations, risks, security and privacy concerns of performing an evaluation and management service by telephone and the availability of in person appointments. I also discussed with the patient that there may be a patient responsible charge related to this service. The patient expressed understanding and agreed to proceed.   History and Present Illness:  URI  This is a new problem. Episode onset: in the past 3 days. The problem has been unchanged. There has been no fever. Associated symptoms include congestion and coughing. Pertinent negatives include no diarrhea or headaches. He has tried nothing for the symptoms.     Review of Systems  Constitutional:  Positive for chills and fever.  HENT:  Positive for congestion.   Respiratory:  Positive for cough.   Gastrointestinal:  Negative for diarrhea.  Neurological:  Negative for headaches.  All other systems reviewed and are negative.   Observations/Objective: Televisit patient not in distress.  Assessment and Plan: Take meds as prescribed - Use a cool mist humidifier  -Use saline nose sprays frequently -Force fluids -For fever or aches or pains- take Tylenol or ibuprofen. -At home test positive  for COVID-19 -Paxlovid antiviral RX sent to pharmacy, patient knows to hold colchicine until he completes his antiviral. Follow up with worsening unresolved symptoms   Follow Up Instructions: Follow-up with unresolved symptoms.    I discussed the assessment and treatment plan with the patient. The patient was provided an opportunity to ask questions and all were answered. The patient agreed with the plan and demonstrated an understanding of the instructions.   The patient was advised to call back or seek an in-person evaluation if the symptoms worsen or if the condition fails to improve as anticipated.  The above assessment and management plan was discussed with the patient. The patient verbalized understanding of and has agreed to the management plan. Patient is aware to call the clinic if symptoms persist or worsen. Patient is aware when to return to the clinic for a follow-up visit. Patient educated on when it is appropriate to go to the emergency department.   Time call ended:  11:27 am   I provided 7 minutes of  non face-to-face time during this encounter.    Ivy Lynn, NP

## 2021-07-11 ENCOUNTER — Ambulatory Visit (INDEPENDENT_AMBULATORY_CARE_PROVIDER_SITE_OTHER): Payer: Medicare HMO

## 2021-07-11 VITALS — Ht 71.0 in | Wt 210.0 lb

## 2021-07-11 DIAGNOSIS — Z0000 Encounter for general adult medical examination without abnormal findings: Secondary | ICD-10-CM | POA: Diagnosis not present

## 2021-07-11 NOTE — Patient Instructions (Signed)
Nicholas Hawkins , Thank you for taking time to come for your Medicare Wellness Visit. I appreciate your ongoing commitment to your health goals. Please review the following plan we discussed and let me know if I can assist you in the future.   Screening recommendations/referrals: Colonoscopy: No longer required due to age.  Recommended yearly ophthalmology/optometry visit for glaucoma screening and checkup Recommended yearly dental visit for hygiene and checkup  Vaccinations: Influenza vaccine: Done 05/02/2021 Repeat annually  Pneumococcal vaccine: Done 07/14/2013 and 03/30/2020 Tdap vaccine: Done 10/03/2012 Repeat in 10 years  Shingles vaccine: Done 04/29/2019.   Covid-19: Due.  Advanced directives: Please bring a copy of your health care power of attorney and living will to the office to be added to your chart at your convenience.   Conditions/risks identified: Aim for 30 minutes of exercise or brisk walking each day, drink 6-8 glasses of water and eat lots of fruits and vegetables.   Next appointment: Follow up in one year for your annual wellness visit. 2024   Preventive Care 84 Years and Older, Male  Preventive care refers to lifestyle choices and visits with your health care provider that can promote health and wellness. What does preventive care include? A yearly physical exam. This is also called an annual well check. Dental exams once or twice a year. Routine eye exams. Ask your health care provider how often you should have your eyes checked. Personal lifestyle choices, including: Daily care of your teeth and gums. Regular physical activity. Eating a healthy diet. Avoiding tobacco and drug use. Limiting alcohol use. Practicing safe sex. Taking low doses of aspirin every day. Taking vitamin and mineral supplements as recommended by your health care provider. What happens during an annual well check? The services and screenings done by your health care provider during your  annual well check will depend on your age, overall health, lifestyle risk factors, and family history of disease. Counseling  Your health care provider may ask you questions about your: Alcohol use. Tobacco use. Drug use. Emotional well-being. Home and relationship well-being. Sexual activity. Eating habits. History of falls. Memory and ability to understand (cognition). Work and work Statistician. Screening  You may have the following tests or measurements: Height, weight, and BMI. Blood pressure. Lipid and cholesterol levels. These may be checked every 5 years, or more frequently if you are over 48 years old. Skin check. Lung cancer screening. You may have this screening every year starting at age 63 if you have a 30-pack-year history of smoking and currently smoke or have quit within the past 15 years. Fecal occult blood test (FOBT) of the stool. You may have this test every year starting at age 8. Flexible sigmoidoscopy or colonoscopy. You may have a sigmoidoscopy every 5 years or a colonoscopy every 10 years starting at age 5. Prostate cancer screening. Recommendations will vary depending on your family history and other risks. Hepatitis C blood test. Hepatitis B blood test. Sexually transmitted disease (STD) testing. Diabetes screening. This is done by checking your blood sugar (glucose) after you have not eaten for a while (fasting). You may have this done every 1-3 years. Abdominal aortic aneurysm (AAA) screening. You may need this if you are a current or former smoker. Osteoporosis. You may be screened starting at age 60 if you are at high risk. Talk with your health care provider about your test results, treatment options, and if necessary, the need for more tests. Vaccines  Your health care provider may recommend certain  vaccines, such as: Influenza vaccine. This is recommended every year. Tetanus, diphtheria, and acellular pertussis (Tdap, Td) vaccine. You may need a Td  booster every 10 years. Zoster vaccine. You may need this after age 26. Pneumococcal 13-valent conjugate (PCV13) vaccine. One dose is recommended after age 38. Pneumococcal polysaccharide (PPSV23) vaccine. One dose is recommended after age 23. Talk to your health care provider about which screenings and vaccines you need and how often you need them. This information is not intended to replace advice given to you by your health care provider. Make sure you discuss any questions you have with your health care provider. Document Released: 06/25/2015 Document Revised: 02/16/2016 Document Reviewed: 03/30/2015 Elsevier Interactive Patient Education  2017 [place] Prevention in the Home Falls can cause injuries. They can happen to people of all ages. There are many things you can do to make your home safe and to help prevent falls. What can I do on the outside of my home? Regularly fix the edges of walkways and driveways and fix any cracks. Remove anything that might make you trip as you walk through a door, such as a raised step or threshold. Trim any bushes or trees on the path to your home. Use bright outdoor lighting. Clear any walking paths of anything that might make someone trip, such as rocks or tools. Regularly check to see if handrails are loose or broken. Make sure that both sides of any steps have handrails. Any raised decks and porches should have guardrails on the edges. Have any leaves, snow, or ice cleared regularly. Use sand or salt on walking paths during winter. Clean up any spills in your garage right away. This includes oil or grease spills. What can I do in the bathroom? Use night lights. Install grab bars by the toilet and in the tub and shower. Do not use towel bars as grab bars. Use non-skid mats or decals in the tub or shower. If you need to sit down in the shower, use a plastic, non-slip stool. Keep the floor dry. Clean up any water that spills on the floor  as soon as it happens. Remove soap buildup in the tub or shower regularly. Attach bath mats securely with double-sided non-slip rug tape. Do not have throw rugs and other things on the floor that can make you trip. What can I do in the bedroom? Use night lights. Make sure that you have a light by your bed that is easy to reach. Do not use any sheets or blankets that are too big for your bed. They should not hang down onto the floor. Have a firm chair that has side arms. You can use this for support while you get dressed. Do not have throw rugs and other things on the floor that can make you trip. What can I do in the kitchen? Clean up any spills right away. Avoid walking on wet floors. Keep items that you use a lot in easy-to-reach places. If you need to reach something above you, use a strong step stool that has a grab bar. Keep electrical cords out of the way. Do not use floor polish or wax that makes floors slippery. If you must use wax, use non-skid floor wax. Do not have throw rugs and other things on the floor that can make you trip. What can I do with my stairs? Do not leave any items on the stairs. Make sure that there are handrails on both sides of the stairs  and use them. Fix handrails that are broken or loose. Make sure that handrails are as long as the stairways. Check any carpeting to make sure that it is firmly attached to the stairs. Fix any carpet that is loose or worn. Avoid having throw rugs at the top or bottom of the stairs. If you do have throw rugs, attach them to the floor with carpet tape. Make sure that you have a light switch at the top of the stairs and the bottom of the stairs. If you do not have them, ask someone to add them for you. What else can I do to help prevent falls? Wear shoes that: Do not have high heels. Have rubber bottoms. Are comfortable and fit you well. Are closed at the toe. Do not wear sandals. If you use a stepladder: Make sure that it is  fully opened. Do not climb a closed stepladder. Make sure that both sides of the stepladder are locked into place. Ask someone to hold it for you, if possible. Clearly mark and make sure that you can see: Any grab bars or handrails. First and last steps. Where the edge of each step is. Use tools that help you move around (mobility aids) if they are needed. These include: Canes. Walkers. Scooters. Crutches. Turn on the lights when you go into a dark area. Replace any light bulbs as soon as they burn out. Set up your furniture so you have a clear path. Avoid moving your furniture around. If any of your floors are uneven, fix them. If there are any pets around you, be aware of where they are. Review your medicines with your doctor. Some medicines can make you feel dizzy. This can increase your chance of falling. Ask your doctor what other things that you can do to help prevent falls. This information is not intended to replace advice given to you by your health care provider. Make sure you discuss any questions you have with your health care provider. Document Released: 03/25/2009 Document Revised: 11/04/2015 Document Reviewed: 07/03/2014 Elsevier Interactive Patient Education  2017 [employer].

## 2021-07-11 NOTE — Progress Notes (Signed)
Subjective:   Nicholas Hawkins is a 86 y.o. male who presents for Medicare Annual/Subsequent preventive examination. Virtual Visit via Telephone Note  I connected with  Nicholas Hawkins on 07/11/21 at  9:45 AM EST by telephone and verified that I am speaking with the correct person using two identifiers.  Location: Patient: HOME Provider: [facility] Persons participating in the virtual visit: patient/Nurse Health Advisor   I discussed the limitations, risks, security and privacy concerns of performing an evaluation and management service by telephone and the availability of in person appointments. The patient expressed understanding and agreed to proceed.  Interactive audio and video telecommunications were attempted between this nurse and patient, however failed, due to patient having technical difficulties OR patient did not have access to video capability.  We continued and completed visit with audio only.  Some vital signs may be absent or patient reported.   Chriss Driver, LPN  Review of Systems     Cardiac Risk Factors include: advanced age (>46men, >20 women);hypertension;sedentary lifestyle;male gender  PHONE VISIT. PT AT HOME. NURSE AT [facility].    Objective:    Today's Vitals   07/11/21 1004  Weight: 210 lb (95.3 kg)  Height: 5\' 11"  (1.803 m)   Body mass index is 29.29 kg/m.  Advanced Directives 07/11/2021 08/27/2017 05/09/2017 04/30/2015  Does Patient Have a Medical Advance Directive? Yes No Yes No  Type of Paramedic of [place];Living will - [facility];Living will -  Copy of [place] in Chart? No - copy requested - No - copy requested -  Would patient like information on creating a medical advance directive? - No - Patient declined - -    Current Medications (verified) Outpatient Encounter Medications as of 07/11/2021  Medication Sig   diclofenac (VOLTAREN) 75 MG EC tablet Take 1 tablet (75 mg total) by  mouth 2 (two) times daily.   fexofenadine (ALLEGRA ALLERGY) 180 MG tablet Take 1 tablet (180 mg total) by mouth daily.   guaiFENesin (MUCINEX) 600 MG 12 hr tablet Take 1 tablet (600 mg total) by mouth 2 (two) times daily.   lisinopril (ZESTRIL) 10 MG tablet TAKE ONE (1) TABLET EACH DAY (NEEDS TO BE SEEN BEFORE NEXT REFILL)   meclizine (ANTIVERT) 25 MG tablet Take 1 tablet (25 mg total) by mouth 3 (three) times daily as needed for dizziness.   Olopatadine HCl (PATADAY) 0.2 % SOLN Apply 1 drop to eye daily.   PARoxetine (PAXIL) 40 MG tablet Take 1 tablet (40 mg total) by mouth every morning.   predniSONE (STERAPRED UNI-PAK 21 TAB) 10 MG (21) TBPK tablet Use as directed   triamcinolone cream (KENALOG) 0.1 % Apply topically.   No facility-administered encounter medications on file as of 07/11/2021.    Allergies (verified) Patient has no known allergies.   History: Past Medical History:  Diagnosis Date   Hyperlipidemia    HYPERTENSION, MILD    Low testosterone    Obesity    Obesity, unspecified    Past Surgical History:  Procedure Laterality Date   APPENDECTOMY     KNEE SURGERY Right    Family History  Problem Relation Age of Onset   Heart disease Mother    Early death Sister    Aneurysm Brother    Social History   Socioeconomic History   Marital status: Married    Spouse name: Not on file   Number of children: 2   Years of education: Not on  file   Highest education level: 8th grade  Occupational History   Not on file  Tobacco Use   Smoking status: Never   Smokeless tobacco: Never  Vaping Use   Vaping Use: Never used  Substance and Sexual Activity   Alcohol use: No   Drug use: No   Sexual activity: Never  Other Topics Concern   Not on file  Social History Narrative   Not on file   Social Determinants of Health   Financial Resource Strain: Low Risk    Difficulty of Paying Living Expenses: Not hard at all  Food Insecurity: No Food Insecurity   Worried About  Charity fundraiser in the Last Year: Never true   [place] in the Last Year: Never true  Transportation Needs: No Transportation Needs   Lack of Transportation (Medical): No   Lack of Transportation (Non-Medical): No  Physical Activity: Sufficiently Active   Days of Exercise per Week: 5 days   Minutes of Exercise per Session: 30 min  Stress: No Stress Concern Present   Feeling of Stress : Not at all  Social Connections: Socially Integrated   Frequency of Communication with Friends and Family: More than three times a week   Frequency of Social Gatherings with Friends and Family: More than three times a week   Attends Religious Services: 1 to 4 times per year   Active Member of [employer] or Organizations: No   Attends Music therapist: 1 to 4 times per year   Marital Status: Married    Tobacco Counseling Counseling given: Not Answered   Clinical Intake:  Pre-visit preparation completed: Yes  Pain : No/denies pain     BMI - recorded: 29.29 Nutritional Status: BMI 25 -29 Overweight Nutritional Risks: None Diabetes: No  How often do you need to have someone help you when you read instructions, pamphlets, or other written materials from your doctor or pharmacy?: 1 - Never  Diabetic?NO  Interpreter Needed?: No  Information entered by :: mj Christofer Shen, lpn   Activities of Daily Living In your present state of health, do you have any difficulty performing the following activities: 07/11/2021  Hearing? Y  Comment WEARS HEARING AIDS  Vision? N  Difficulty concentrating or making decisions? Y  Walking or climbing stairs? N  Dressing or bathing? N  Doing errands, shopping? N  Preparing Food and eating ? N  Using the Toilet? N  In the past six months, have you accidently leaked urine? N  Do you have problems with loss of bowel control? N  Managing your Medications? N  Managing your Finances? N  Housekeeping or managing your Housekeeping? N  Some recent data  might be hidden    Patient Care Team: Sharion Balloon, FNP as PCP - General (Family Medicine)  Indicate any recent Medical Services you may have received from other than [facility] providers in the past year (date may be approximate).     Assessment:   This is a routine wellness examination for [name].  Hearing/Vision screen Hearing Screening - Comments:: Wears hearing aids. Vision Screening - Comments:: No glasses. 2022.   Dietary issues and exercise activities discussed: Current Exercise Habits: Home exercise routine, Type of exercise: walking, Time (Minutes): 30, Frequency (Times/Week): 5, Weekly Exercise (Minutes/Week): 150, Intensity: Mild, Exercise limited by: cardiac condition(s);orthopedic condition(s)   Goals Addressed             This Visit's Progress    Exercise 3x per week (30  min per time)   On track    Have 3 meals a day   On track      Depression Screen PHQ 2/9 Scores 07/11/2021 03/18/2021 09/09/2020 04/29/2019 06/13/2018 04/09/2018 09/20/2017  PHQ - 2 Score 0 0 0 0 0 0 0  PHQ- 9 Score - 0 0 - - - -    Fall Risk Fall Risk  07/11/2021 09/09/2020 04/29/2019 06/13/2018 04/09/2018  Falls in the past year? 0 0 0 0 Yes  Number falls in past yr: 0 - - - 1  Injury with Fall? 0 - - - Yes  Risk for fall due to : No Fall Risks - - - -  Follow up Falls prevention discussed - - - -    FALL RISK PREVENTION PERTAINING TO THE HOME:  Any stairs in or around the home? Yes  If so, are there any without handrails? No  Home free of loose throw rugs in walkways, pet beds, electrical cords, etc? Yes  Adequate lighting in your home to reduce risk of falls? Yes   ASSISTIVE DEVICES UTILIZED TO PREVENT FALLS:  Life alert? No  Use of a cane, walker or w/c? Yes  Grab bars in the bathroom? Yes  Shower chair or bench in shower? No  Elevated toilet seat or a handicapped toilet? No   TIMED UP AND GO:  Was the test performed? No .  Phone visit.  Cognitive Function: MMSE - Mini Mental  State Exam 05/09/2017  Not completed: Unable to complete     6CIT Screen 07/11/2021  What Year? 0 points  What month? 0 points  What time? 0 points  Count back from 20 0 points  Months in reverse 4 points  Repeat phrase 10 points  Total Score 14    Immunizations Immunization History  Administered Date(s) Administered   Fluad Quad(high Dose 65+) 04/29/2019, 03/30/2020, 05/02/2021   Influenza, High Dose Seasonal PF 03/22/2017, 03/06/2018   Influenza,inj,Quad PF,6+ Mos 03/06/2013, 04/07/2014, 04/30/2015, 03/22/2016   Influenza-Unspecified 03/30/2009, 04/02/2012   Pneumococcal Conjugate-13 07/14/2013   Pneumococcal Polysaccharide-23 03/30/2020   Pneumococcal-Unspecified 06/12/2005   Td 10/03/2012   Tdap 10/03/2012   Zoster Recombinat (Shingrix) 04/29/2019    TDAP status: Up to date  Flu Vaccine status: Up to date  Pneumococcal vaccine status: Up to date  Covid-19 vaccine status: Declined, Education has been provided regarding the importance of this vaccine but patient still declined. Advised may receive this vaccine at local pharmacy or Health Dept.or vaccine clinic. Aware to provide a copy of the vaccination record if obtained from local pharmacy or Health Dept. Verbalized acceptance and understanding.  Qualifies for Shingles Vaccine? Yes   Zostavax completed Yes   Shingrix Completed?: No.    Education has been provided regarding the importance of this vaccine. Patient has been advised to call insurance company to determine out of pocket expense if they have not yet received this vaccine. Advised may also receive vaccine at local pharmacy or Health Dept. Verbalized acceptance and understanding.  Screening Tests Health Maintenance  Topic Date Due   COVID-19 Vaccine (1) Never done   Zoster Vaccines- Shingrix (2 of 2) 06/24/2019   TETANUS/TDAP  10/04/2022   Pneumonia Vaccine 29+ Years old  Completed   INFLUENZA VACCINE  Completed   HPV VACCINES  Aged Out    Health  Maintenance  Health Maintenance Due  Topic Date Due   COVID-19 Vaccine (1) Never done   Zoster Vaccines- Shingrix (2 of 2) 06/24/2019  Colorectal cancer screening: No longer required.   Lung Cancer Screening: (Low Dose CT Chest recommended if Age 76-80 years, 30 pack-year currently smoking OR have quit w/in 15years.) does not qualify.  Additional Screening:  Hepatitis C Screening: does not qualify;   Vision Screening: Recommended annual ophthalmology exams for early detection of glaucoma and other disorders of the eye. Is the patient up to date with their annual eye exam?  Yes  Who is the provider or what is the name of the office in which the patient attends annual eye exams? Pt unable to remember name of provider. If pt is not established with a provider, would they like to be referred to a provider to establish care? No .   Dental Screening: Recommended annual dental exams for proper oral hygiene  Community Resource Referral / Chronic Care Management: CRR required this visit?  No   CCM required this visit?  No      Plan:     I have personally reviewed and noted the following in the patients chart:   Medical and social history Use of alcohol, tobacco or illicit drugs  Current medications and supplements including opioid prescriptions.  Functional ability and status Nutritional status Physical activity Advanced directives List of other physicians Hospitalizations, surgeries, and ER visits in previous 12 months Vitals Screenings to include cognitive, depression, and falls Referrals and appointments  In addition, I have reviewed and discussed with patient certain preventive protocols, quality metrics, and best practice recommendations. A written personalized care plan for preventive services as well as general preventive health recommendations were provided to patient.     Chriss Driver, LPN   6/83/7290   Nurse Notes: Discussed Shingrix and Covid vaccines  and how to obtain. 6CIT score of 14.

## 2021-07-21 ENCOUNTER — Other Ambulatory Visit: Payer: Self-pay | Admitting: Family

## 2021-07-21 DIAGNOSIS — F320 Major depressive disorder, single episode, mild: Secondary | ICD-10-CM

## 2021-07-21 DIAGNOSIS — I10 Essential (primary) hypertension: Secondary | ICD-10-CM

## 2021-07-26 ENCOUNTER — Encounter: Payer: Self-pay | Admitting: Family Medicine

## 2021-07-26 ENCOUNTER — Ambulatory Visit (INDEPENDENT_AMBULATORY_CARE_PROVIDER_SITE_OTHER): Payer: Medicare HMO | Admitting: Family Medicine

## 2021-07-26 VITALS — BP 139/77 | HR 70 | Temp 97.4°F | Ht 71.0 in | Wt 213.0 lb

## 2021-07-26 DIAGNOSIS — M542 Cervicalgia: Secondary | ICD-10-CM | POA: Diagnosis not present

## 2021-07-26 DIAGNOSIS — Z23 Encounter for immunization: Secondary | ICD-10-CM

## 2021-07-26 DIAGNOSIS — M7989 Other specified soft tissue disorders: Secondary | ICD-10-CM

## 2021-07-26 NOTE — Addendum Note (Signed)
Addended byCarrolyn Leigh on: 07/26/2021 12:42 PM   Modules accepted: Orders

## 2021-07-26 NOTE — Progress Notes (Signed)
Subjective:  Patient ID: Nicholas Hawkins, male    DOB: May 08, 1936, 86 y.o.   MRN: [id number]  Patient Care Team: Sharion Balloon, FNP as PCP - General (Family Medicine)   Chief Complaint:  Neck Pain   HPI: Nicholas Hawkins is a 86 y.o. male presenting on 07/26/2021 for Neck Pain   Pt presents today with complaints of localized swelling to left lateral neck with tenderness/pain. States this started over 2 months ago and does not seem to be improving. States area is tender to touch and feels like a shooting pain if he rubs the area. No fever, chills, weight changes, weakness, confusion, or other associated symptoms.   Neck Pain  This is a new problem. The current episode started more than 1 month ago. The problem occurs intermittently. The problem has been waxing and waning. The pain is associated with nothing. The pain is present in the left side. The quality of the pain is described as burning, shooting and aching. The pain is at a severity of 4/10. The pain is mild. Exacerbated by: palpation. Pertinent negatives include no chest pain, fever, headaches, numbness or weakness. He has tried nothing for the symptoms.    Relevant past medical, surgical, family, and social history reviewed and updated as indicated.  Allergies and medications reviewed and updated. Data reviewed: Chart in Epic.   Past Medical History:  Diagnosis Date   Hyperlipidemia    HYPERTENSION, MILD    Low testosterone    Obesity    Obesity, unspecified     Past Surgical History:  Procedure Laterality Date   APPENDECTOMY     KNEE SURGERY Right     Social History   Socioeconomic History   Marital status: Married    Spouse name: Not on file   Number of children: 2   Years of education: Not on file   Highest education level: 8th grade  Occupational History   Not on file  Tobacco Use   Smoking status: Never   Smokeless tobacco: Never  Vaping Use   Vaping Use: Never used  Substance and Sexual Activity    Alcohol use: No   Drug use: No   Sexual activity: Never  Other Topics Concern   Not on file  Social History Narrative   Not on file   Social Determinants of Health   Financial Resource Strain: Low Risk    Difficulty of Paying Living Expenses: Not hard at all  Food Insecurity: No Food Insecurity   Worried About Charity fundraiser in the Last Year: Never true   [place] in the Last Year: Never true  Transportation Needs: No Transportation Needs   Lack of Transportation (Medical): No   Lack of Transportation (Non-Medical): No  Physical Activity: Sufficiently Active   Days of Exercise per Week: 5 days   Minutes of Exercise per Session: 30 min  Stress: No Stress Concern Present   Feeling of Stress : Not at all  Social Connections: Socially Integrated   Frequency of Communication with Friends and Family: More than three times a week   Frequency of Social Gatherings with Friends and Family: More than three times a week   Attends Religious Services: 1 to 4 times per year   Active Member of [employer] or Organizations: No   Attends Music therapist: 1 to 4 times per year   Marital Status: Married  Human resources officer Violence: Not At Risk   Fear of Current  or Ex-Partner: No   Emotionally Abused: No   Physically Abused: No   Sexually Abused: No    Outpatient Encounter Medications as of 07/26/2021  Medication Sig   PARoxetine (PAXIL) 40 MG tablet TAKE ONE TABLET BY MOUTH EVERY MORNING   diclofenac (VOLTAREN) 75 MG EC tablet Take 1 tablet (75 mg total) by mouth 2 (two) times daily. (Patient not taking: Reported on 07/26/2021)   lisinopril (ZESTRIL) 10 MG tablet TAKE ONE (1) TABLET EACH DAY   [DISCONTINUED] fexofenadine (ALLEGRA ALLERGY) 180 MG tablet Take 1 tablet (180 mg total) by mouth daily.   [DISCONTINUED] guaiFENesin (MUCINEX) 600 MG 12 hr tablet Take 1 tablet (600 mg total) by mouth 2 (two) times daily.   [DISCONTINUED] meclizine (ANTIVERT) 25 MG tablet Take 1  tablet (25 mg total) by mouth 3 (three) times daily as needed for dizziness.   [DISCONTINUED] Olopatadine HCl (PATADAY) 0.2 % SOLN Apply 1 drop to eye daily.   [DISCONTINUED] predniSONE (STERAPRED UNI-PAK 21 TAB) 10 MG (21) TBPK tablet Use as directed   [DISCONTINUED] triamcinolone cream (KENALOG) 0.1 % Apply topically.   No facility-administered encounter medications on file as of 07/26/2021.    No Known Allergies  Review of Systems  Constitutional:  Negative for activity change, appetite change, chills, diaphoresis, fatigue, fever and unexpected weight change.  Respiratory:  Negative for cough and shortness of breath.   Cardiovascular:  Negative for chest pain, palpitations and leg swelling.  Musculoskeletal:  Positive for neck pain. Negative for arthralgias, back pain, gait problem, joint swelling, myalgias and neck stiffness.  Neurological:  Negative for dizziness, tremors, seizures, syncope, facial asymmetry, speech difficulty, weakness, light-headedness, numbness and headaches.  Hematological:  Positive for adenopathy. Does not bruise/bleed easily.  Psychiatric/Behavioral:  Negative for confusion.   All other systems reviewed and are negative.      Objective:  BP 139/77    Pulse 70    Temp (!) 97.4 F (36.3 C) (Temporal)    Ht _0  (1.803 m)    Wt 213 lb (96.6 kg)    BMI 29.71 kg/m    Wt Readings from Last 3 Encounters:  07/26/21 213 lb (96.6 kg)  07/11/21 210 lb (95.3 kg)  03/18/21 210 lb (95.3 kg)    Physical Exam Vitals and nursing note reviewed.  Constitutional:      General: He is not in acute distress.    Appearance: Normal appearance. He is overweight. He is not ill-appearing, toxic-appearing or diaphoretic.  HENT:     Head: Normocephalic and atraumatic.     Right Ear: Tympanic membrane, ear canal and external ear normal.     Left Ear: Tympanic membrane, ear canal and external ear normal.     Nose: Nose normal.     Mouth/Throat:     Mouth: Mucous membranes  are moist.     Pharynx: Oropharynx is clear. No oropharyngeal exudate or posterior oropharyngeal erythema.  Eyes:     Conjunctiva/sclera: Conjunctivae normal.     Pupils: Pupils are equal, round, and reactive to light.  Pulmonary:     Effort: Pulmonary effort is normal.     Breath sounds: Normal breath sounds.  Abdominal:     General: Bowel sounds are normal.     Palpations: Abdomen is soft.  Musculoskeletal:        General: Normal range of motion.     Right lower leg: No edema.     Left lower leg: No edema.  Lymphadenopathy:     Head:  Right side of head: No submental, submandibular, tonsillar, preauricular, posterior auricular or occipital adenopathy.     Left side of head: Preauricular and posterior auricular adenopathy present. No submental, submandibular, tonsillar or occipital adenopathy.     Cervical:     Right cervical: No superficial, deep or posterior cervical adenopathy.    Left cervical: No superficial, deep or posterior cervical adenopathy.  Skin:    General: Skin is warm and dry.     Capillary Refill: Capillary refill takes less than 2 seconds.       Neurological:     General: No focal deficit present.     Mental Status: He is alert and oriented to person, place, and time.     Cranial Nerves: No cranial nerve deficit.     Sensory: No sensory deficit.     Motor: No weakness.     Coordination: Coordination normal.     Gait: Gait normal.     Deep Tendon Reflexes: Reflexes normal.    Results for orders placed or performed in visit on 09/09/20  CMP14+EGFR  Result Value Ref Range   Glucose 111 (H) 65 - 99 mg/dL   BUN 16 8 - 27 mg/dL   Creatinine, Ser 0.96 0.76 - 1.27 mg/dL   eGFR 79 >59 mL/min/1.73   BUN/Creatinine Ratio 17 10 - 24   Sodium 142 134 - 144 mmol/L   Potassium 4.6 3.5 - 5.2 mmol/L   Chloride 101 96 - 106 mmol/L   CO2 25 20 - 29 mmol/L   Calcium 9.3 8.6 - 10.2 mg/dL   Total Protein 7.2 6.0 - 8.5 g/dL   Albumin 4.6 3.6 - 4.6 g/dL   Globulin,  Total 2.6 1.5 - 4.5 g/dL   Albumin/Globulin Ratio 1.8 1.2 - 2.2   Bilirubin Total 0.6 0.0 - 1.2 mg/dL   Alkaline Phosphatase 145 (H) 44 - 121 IU/L   AST 17 0 - 40 IU/L   ALT 15 0 - 44 IU/L  CBC with Differential/Platelet  Result Value Ref Range   WBC 5.1 3.4 - 10.8 x10E3/uL   RBC 5.12 4.14 - 5.80 x10E6/uL   Hemoglobin 15.7 13.0 - 17.7 g/dL   Hematocrit 45.8 37.5 - 51.0 %   MCV 90 79 - 97 fL   MCH 30.7 26.6 - 33.0 pg   MCHC 34.3 31.5 - 35.7 g/dL   RDW 12.9 11.6 - 15.4 %   Platelets 195 150 - 450 x10E3/uL   Neutrophils 60 Not Estab. %   Lymphs 29 Not Estab. %   Monocytes 9 Not Estab. %   Eos 1 Not Estab. %   Basos 1 Not Estab. %   Neutrophils Absolute 3.1 1.4 - 7.0 x10E3/uL   Lymphocytes Absolute 1.5 0.7 - 3.1 x10E3/uL   Monocytes Absolute 0.5 0.1 - 0.9 x10E3/uL   EOS (ABSOLUTE) 0.1 0.0 - 0.4 x10E3/uL   Basophils Absolute 0.0 0.0 - 0.2 x10E3/uL   Immature Granulocytes 0 Not Estab. %   Immature Grans (Abs) 0.0 0.0 - 0.1 x10E3/uL       Pertinent labs & imaging results that were available during my care of the patient were reviewed by me and considered in my medical decision making.  Assessment & Plan:  Nicholas Hawkins was seen today for neck pain.  Diagnoses and all orders for this visit:  Neck pain Mass of soft tissue of neck Mass of left lateral neck with tenderness. No other associated symptoms. Will obtain soft tissue US. Further treatment pending results. Pt aware  to report any new, worsening, or persistent symptoms. Follow up in 3-4 weeks for reevaluation or sooner if warranted.  -     US Soft Tissue Head/Neck (NON-THYROID); Future  Shingrix vaccination updated   Continue all other maintenance medications.  Follow up plan: Return in about 4 weeks (around 08/23/2021), or if symptoms worsen or fail to improve, for lymphadenopathy.   Continue healthy lifestyle choices, including diet (rich in fruits, vegetables, and lean proteins, and low in salt and simple carbohydrates) and  exercise (at least 30 minutes of moderate physical activity daily).  Educational handout given for lymphadenopathy  The above assessment and management plan was discussed with the patient. The patient verbalized understanding of and has agreed to the management plan. Patient is aware to call the clinic if they develop any new symptoms or if symptoms persist or worsen. Patient is aware when to return to the clinic for a follow-up visit. Patient educated on when it is appropriate to go to the emergency department.   Monia Pouch, FNP-C [facility] [phone]

## 2021-08-04 ENCOUNTER — Other Ambulatory Visit: Payer: Self-pay

## 2021-08-04 ENCOUNTER — Ambulatory Visit (HOSPITAL_COMMUNITY)
Admission: RE | Admit: 2021-08-04 | Discharge: 2021-08-04 | Disposition: A | Discharge status: Home / Self Care | Payer: Medicare HMO | Source: Ambulatory Visit | Attending: Family Medicine | Admitting: Family Medicine

## 2021-08-04 DIAGNOSIS — M7989 Other specified soft tissue disorders: Secondary | ICD-10-CM | POA: Diagnosis not present

## 2021-08-04 DIAGNOSIS — M542 Cervicalgia: Secondary | ICD-10-CM | POA: Diagnosis not present

## 2021-08-04 DIAGNOSIS — R221 Localized swelling, mass and lump, neck: Secondary | ICD-10-CM | POA: Diagnosis not present

## 2021-08-23 ENCOUNTER — Ambulatory Visit (INDEPENDENT_AMBULATORY_CARE_PROVIDER_SITE_OTHER): Payer: Medicare HMO | Admitting: Family

## 2021-08-23 ENCOUNTER — Encounter: Payer: Self-pay | Admitting: Family

## 2021-08-23 VITALS — BP 142/72 | HR 62 | Temp 97.8°F | Ht 71.0 in | Wt 212.0 lb

## 2021-08-23 DIAGNOSIS — R591 Generalized enlarged lymph nodes: Secondary | ICD-10-CM

## 2021-08-23 DIAGNOSIS — B354 Tinea corporis: Secondary | ICD-10-CM | POA: Diagnosis not present

## 2021-08-23 MED ORDER — KETOCONAZOLE 2 % EX CREA: 1.0000 "application " | TOPICAL_CREAM | Freq: Every day | CUTANEOUS | 0 refills | Status: AC

## 2021-08-23 NOTE — Patient Instructions (Signed)
Body Ringworm ?Body ringworm is an infection of the skin that often causes a ring-shaped rash. Body ringworm is also called tinea corporis. ?Body ringworm can affect any part of your skin. This condition is easily spread from person to person (is very contagious). ?What are the causes? ?This condition is caused by fungi called dermatophytes. The condition develops when these fungi grow out of control on the skin. ?You can get this condition if you touch a person or animal that has it. You can also get it if you share any items with an infected person or pet. These include: ?Clothing, bedding, and towels. ?Brushes or combs. ?Gym equipment. ?Any other object that has the fungus on it. ?What increases the risk? ?You are more likely to develop this condition if you: ?Play sports that involve close physical contact, such as wrestling. ?Sweat a lot. ?Live in areas that are hot and humid. ?Use public showers. ?Have a weakened immune system. ?What are the signs or symptoms? ?Symptoms of this condition include: ?Itchy, raised red spots and bumps. ?Red scaly patches. ?A ring-shaped rash. The rash may have: ?A clear center. ?Scales or red bumps at its center. ?Redness near its borders. ?Dry and scaly skin on or around it. ?How is this diagnosed? ?This condition can usually be diagnosed with a skin exam. A skin scraping may be taken from the affected area and examined under a microscope to see if the fungus is present. ?How is this treated? ?This condition may be treated with: ?An antifungal cream or ointment. ?An antifungal shampoo. ?Antifungal medicines. These may be prescribed if your ringworm: ?Is severe. ?Keeps coming back. ?Lasts a long time. ?Follow these instructions at home: ?Take over-the-counter and prescription medicines only as told by your health care provider. ?If you were given an antifungal cream or ointment: ?Use it as told by your health care provider. ?Wash the infected area and dry it completely before  applying the cream or ointment. ?If you were given an antifungal shampoo: ?Use it as told by your health care provider. ?Leave the shampoo on your body for 3-5 minutes before rinsing. ?While you have a rash: ?Wear loose clothing to stop clothes from rubbing and irritating it. ?Wash or change your bed sheets every night. ?Disinfect or throw out items that may be infected. ?Wash clothes and bed sheets in hot water. ?Wash your hands often with soap and water. If soap and water are not available, use hand sanitizer. ?If your pet has the same infection, take your pet to see a veterinarian for treatment. ?How is this prevented? ?Take a bath or shower every day and after every time you work out or play sports. ?Dry your skin completely after bathing. ?Wear sandals or shoes in public places and showers. ?Change your clothes every day. ?Wash athletic clothes after each use. ?Do not share personal items with others. ?Avoid touching red patches of skin on other people. ?Avoid touching pets that have bald spots. ?If you touch an animal that has a bald spot, wash your hands. ?Contact a health care provider if: ?Your rash continues to spread after 7 days of treatment. ?Your rash is not gone in 4 weeks. ?The area around your rash gets red, warm, tender, and swollen. ?Summary ?Body ringworm is an infection of the skin that often causes a ring-shaped rash. ?This condition is easily spread from person to person (is very contagious). ?This condition may be treated with antifungal cream or ointment, antifungal shampoo, or antifungal medicines. ?Take over-the-counter and  prescription medicines only as told by your health care provider. ?This information is not intended to replace advice given to you by your health care provider. Make sure you discuss any questions you have with your health care provider. ?Document Revised: 01/25/2018 Document Reviewed: 01/25/2018 ?Elsevier Patient Education ? [employer]. ? ?

## 2021-08-23 NOTE — Progress Notes (Signed)
? ?  Subjective:  ? ? Patient ID: Nicholas Hawkins, male    DOB: [birth year], 86 y.o.   MRN: [id number] ? ?Chief Complaint  ?Patient presents with  ? Follow-up  ?  LUMPHADENOPATHY   ? Rash  ?  ON CHEST   ? ?PT presents to the office today to recheck lymphopathy. He was seen on 07/26/21 and had a US soft tissue ordered that was negative. Denies any pain or fevers.  ? ?He states he has a rash started two week on his chest.  ?Rash ?This is a new problem. The current episode started 1 to 4 weeks ago. The problem has been gradually worsening since onset. The affected locations include the chest. The rash is characterized by itchiness and redness. He was exposed to nothing. Past treatments include nothing. The treatment provided no relief.  ? ? ? ?Review of Systems  ?Skin:  Positive for rash.  ?All other systems reviewed and are negative. ? ?   ?Objective:  ? Physical Exam ?Vitals reviewed.  ?Constitutional:   ?   General: He is not in acute distress. ?   Appearance: He is well-developed. He is obese.  ?HENT:  ?   Head: Normocephalic.  ?Eyes:  ?   General:     ?   Right eye: No discharge.     ?   Left eye: No discharge.  ?   Pupils: Pupils are equal, round, and reactive to light.  ?Neck:  ?   Thyroid: No thyromegaly.  ?Cardiovascular:  ?   Rate and Rhythm: Normal rate and regular rhythm.  ?   Heart sounds: Normal heart sounds. No murmur heard. ?Pulmonary:  ?   Effort: Pulmonary effort is normal. No respiratory distress.  ?   Breath sounds: Normal breath sounds. No wheezing.  ?Abdominal:  ?   General: Bowel sounds are normal. There is no distension.  ?   Palpations: Abdomen is soft.  ?   Tenderness: There is no abdominal tenderness.  ?Musculoskeletal:     ?   General: No tenderness. Normal range of motion.  ?   Cervical back: Normal range of motion and neck supple.  ?Skin: ?   General: Skin is warm and dry.  ?   Findings: Rash present. No erythema.  ? ?    ?   Comments: Circular scaly lesion on chest  ?Neurological:  ?   Mental  Status: He is alert and oriented to person, place, and time.  ?   Cranial Nerves: No cranial nerve deficit.  ?   Deep Tendon Reflexes: Reflexes are normal and symmetric.  ?Psychiatric:     ?   Behavior: Behavior normal.     ?   Thought Content: Thought content normal.     ?   Judgment: Judgment normal.  ? ? ? ?BP (!) 142/72   Pulse 62   Temp 97.8 ?F (36.6 ?C) (Temporal)   Ht '5\' 11"'$  (1.803 m)   Wt 212 lb (96.2 kg)   BMI 29.57 kg/m?  ? ? ?   ?Assessment & Plan:  ?Wilfrido Luedke comes in today with chief complaint of Follow-up (LUMPHADENOPATHY ) and Rash (ON CHEST ) ? ? ?Diagnosis and orders addressed: ? ?1. Tinea corporis ?Good hand hygiene  ?Avoid scratching ?- ketoconazole (NIZORAL) 2 % cream; Apply 1 application. topically daily.  Dispense: 45 g; Refill: 0 ? ? ?2. Lymphadenopathy ?Resolved ? ?Evelina Dun, FNP ? ? ?

## 2021-09-03 ENCOUNTER — Other Ambulatory Visit: Payer: Self-pay | Admitting: Family

## 2021-09-03 DIAGNOSIS — I10 Essential (primary) hypertension: Secondary | ICD-10-CM

## 2021-10-07 ENCOUNTER — Telehealth: Payer: Self-pay | Admitting: Family

## 2021-10-07 DIAGNOSIS — I10 Essential (primary) hypertension: Secondary | ICD-10-CM

## 2021-10-07 NOTE — Telephone Encounter (Signed)
Hawks. NTBS 30 days given 09/05/21 ?

## 2021-10-10 ENCOUNTER — Encounter: Payer: Self-pay | Admitting: Family

## 2021-10-12 MED ORDER — LISINOPRIL 10 MG PO TABS: ORAL_TABLET | ORAL | 0 refills | Status: DC

## 2021-10-12 NOTE — Addendum Note (Signed)
Addended by: Antonietta Barcelona D on: 10/12/2021 04:53 PM ? ? Modules accepted: Orders ? ?

## 2021-10-12 NOTE — Telephone Encounter (Signed)
Apt scheduled for 11/08/2021 next available ?

## 2021-11-02 ENCOUNTER — Other Ambulatory Visit: Payer: Self-pay | Admitting: Family

## 2021-11-02 DIAGNOSIS — I10 Essential (primary) hypertension: Secondary | ICD-10-CM

## 2021-11-08 ENCOUNTER — Ambulatory Visit (INDEPENDENT_AMBULATORY_CARE_PROVIDER_SITE_OTHER): Payer: Medicare HMO | Admitting: Family

## 2021-11-08 ENCOUNTER — Encounter: Payer: Self-pay | Admitting: Family

## 2021-11-08 VITALS — BP 139/75 | HR 65 | Temp 98.1°F | Ht 71.0 in | Wt 213.5 lb

## 2021-11-08 DIAGNOSIS — Z0000 Encounter for general adult medical examination without abnormal findings: Secondary | ICD-10-CM | POA: Diagnosis not present

## 2021-11-08 DIAGNOSIS — Z0001 Encounter for general adult medical examination with abnormal findings: Secondary | ICD-10-CM | POA: Diagnosis not present

## 2021-11-08 DIAGNOSIS — I10 Essential (primary) hypertension: Secondary | ICD-10-CM

## 2021-11-08 DIAGNOSIS — H9193 Unspecified hearing loss, bilateral: Secondary | ICD-10-CM | POA: Diagnosis not present

## 2021-11-08 DIAGNOSIS — F320 Major depressive disorder, single episode, mild: Secondary | ICD-10-CM | POA: Diagnosis not present

## 2021-11-08 DIAGNOSIS — E663 Overweight: Secondary | ICD-10-CM

## 2021-11-08 DIAGNOSIS — E782 Mixed hyperlipidemia: Secondary | ICD-10-CM

## 2021-11-08 NOTE — Patient Instructions (Signed)
Health Maintenance After Age 84 After age 84, you are at a higher risk for certain long-term diseases and infections as well as injuries from falls. Falls are a major cause of broken bones and head injuries in people who are older than age 84. Getting regular preventive care can help to keep you healthy and well. Preventive care includes getting regular testing and making lifestyle changes as recommended by your health care provider. Talk with your health care provider about: Which screenings and tests you should have. A screening is a test that checks for a disease when you have no symptoms. A diet and exercise plan that is right for you. What should I know about screenings and tests to prevent falls? Screening and testing are the best ways to find a health problem early. Early diagnosis and treatment give you the best chance of managing medical conditions that are common after age 84. Certain conditions and lifestyle choices may make you more likely to have a fall. Your health care provider may recommend: Regular vision checks. Poor vision and conditions such as cataracts can make you more likely to have a fall. If you wear glasses, make sure to get your prescription updated if your vision changes. Medicine review. Work with your health care provider to regularly review all of the medicines you are taking, including over-the-counter medicines. Ask your health care provider about any side effects that may make you more likely to have a fall. Tell your health care provider if any medicines that you take make you feel dizzy or sleepy. Strength and balance checks. Your health care provider may recommend certain tests to check your strength and balance while standing, walking, or changing positions. Foot health exam. Foot pain and numbness, as well as not wearing proper footwear, can make you more likely to have a fall. Screenings, including: Osteoporosis screening. Osteoporosis is a condition that causes  the bones to get weaker and break more easily. Blood pressure screening. Blood pressure changes and medicines to control blood pressure can make you feel dizzy. Depression screening. You may be more likely to have a fall if you have a fear of falling, feel depressed, or feel unable to do activities that you used to do. Alcohol use screening. Using too much alcohol can affect your balance and may make you more likely to have a fall. Follow these instructions at home: Lifestyle Do not drink alcohol if: Your health care provider tells you not to drink. If you drink alcohol: Limit how much you have to: 0-1 drink a day for women. 0-2 drinks a day for men. Know how much alcohol is in your drink. In the [place] one drink equals one 12 oz bottle of beer (355 mL), one 5 oz glass of wine (148 mL), or one 1 oz glass of hard liquor (44 mL). Do not use any products that contain nicotine or tobacco. These products include cigarettes, chewing tobacco, and vaping devices, such as e-cigarettes. If you need help quitting, ask your health care provider. Activity  Follow a regular exercise program to stay fit. This will help you maintain your balance. Ask your health care provider what types of exercise are appropriate for you. If you need a cane or walker, use it as recommended by your health care provider. Wear supportive shoes that have nonskid soles. Safety  Remove any tripping hazards, such as rugs, cords, and clutter. Install safety equipment such as grab bars in bathrooms and safety rails on stairs. Keep rooms and walkways   well-lit. General instructions Talk with your health care provider about your risks for falling. Tell your health care provider if: You fall. Be sure to tell your health care provider about all falls, even ones that seem minor. You feel dizzy, tiredness (fatigue), or off-balance. Take over-the-counter and prescription medicines only as told by your health care provider. These include  supplements. Eat a healthy diet and maintain a healthy weight. A healthy diet includes low-fat dairy products, low-fat (lean) meats, and fiber from whole grains, beans, and lots of fruits and vegetables. Stay current with your vaccines. Schedule regular health, dental, and eye exams. Summary Having a healthy lifestyle and getting preventive care can help to protect your health and wellness after age 84. Screening and testing are the best way to find a health problem early and help you avoid having a fall. Early diagnosis and treatment give you the best chance for managing medical conditions that are more common for people who are older than age 84. Falls are a major cause of broken bones and head injuries in people who are older than age 84. Take precautions to prevent a fall at home. Work with your health care provider to learn what changes you can make to improve your health and wellness and to prevent falls. This information is not intended to replace advice given to you by your health care provider. Make sure you discuss any questions you have with your health care provider. Document Revised: 10/18/2020 Document Reviewed: 10/18/2020 Elsevier Patient Education  2023 Elsevier Inc.  

## 2021-11-08 NOTE — Progress Notes (Signed)
Subjective:    Patient ID: Nicholas Hawkins, male    DOB: 14-Aug-1935, 86 y.o.   MRN: [id number]  Chief Complaint  Patient presents with   Medical Management of Chronic Issues   Hypertension   Pt presents to the office today for CPE.  Hypertension This is a chronic problem. The current episode started more than 1 year ago. The problem has been resolved since onset. The problem is controlled. Pertinent negatives include no malaise/fatigue, peripheral edema or shortness of breath. Risk factors for coronary artery disease include dyslipidemia, obesity and male gender. The current treatment provides moderate improvement.  Hyperlipidemia This is a chronic problem. The current episode started more than 1 year ago. The problem is controlled. He has no history of obesity. Pertinent negatives include no shortness of breath. Current antihyperlipidemic treatment includes statins. The current treatment provides moderate improvement of lipids. Risk factors for coronary artery disease include dyslipidemia, male sex, hypertension and a sedentary lifestyle.  Depression        This is a chronic problem.  The current episode started more than 1 year ago.   Associated symptoms include no helplessness, no hopelessness, not irritable and not sad.  Past treatments include SSRIs - Selective serotonin reuptake inhibitors.    Review of Systems  Constitutional:  Negative for malaise/fatigue.  Respiratory:  Negative for shortness of breath.   Psychiatric/Behavioral:  Positive for depression.   All other systems reviewed and are negative.  Family History  Problem Relation Age of Onset   Heart disease Mother    Early death Sister    Aneurysm Brother    Social History   Socioeconomic History   Marital status: Married    Spouse name: Not on file   Number of children: 2   Years of education: Not on file   Highest education level: 8th grade  Occupational History   Not on file  Tobacco Use   Smoking status:  Never   Smokeless tobacco: Never  Vaping Use   Vaping Use: Never used  Substance and Sexual Activity   Alcohol use: No   Drug use: No   Sexual activity: Never  Other Topics Concern   Not on file  Social History Narrative   Not on file   Social Determinants of Health   Financial Resource Strain: Low Risk    Difficulty of Paying Living Expenses: Not hard at all  Food Insecurity: No Food Insecurity   Worried About Charity fundraiser in the Last Year: Never true   [place] in the Last Year: Never true  Transportation Needs: No Transportation Needs   Lack of Transportation (Medical): No   Lack of Transportation (Non-Medical): No  Physical Activity: Sufficiently Active   Days of Exercise per Week: 5 days   Minutes of Exercise per Session: 30 min  Stress: No Stress Concern Present   Feeling of Stress : Not at all  Social Connections: Socially Integrated   Frequency of Communication with Friends and Family: More than three times a week   Frequency of Social Gatherings with Friends and Family: More than three times a week   Attends Religious Services: 1 to 4 times per year   Active Member of [employer] or Organizations: No   Attends Archivist Meetings: 1 to 4 times per year   Marital Status: Married       Objective:   Physical Exam Vitals reviewed.  Constitutional:      General: He is not  irritable.He is not in acute distress.    Appearance: He is well-developed.  HENT:     Head: Normocephalic.     Right Ear: Tympanic membrane normal.     Left Ear: Tympanic membrane normal.  Eyes:     General:        Right eye: No discharge.        Left eye: No discharge.     Pupils: Pupils are equal, round, and reactive to light.  Neck:     Thyroid: No thyromegaly.  Cardiovascular:     Rate and Rhythm: Normal rate and regular rhythm.     Heart sounds: Normal heart sounds. No murmur heard. Pulmonary:     Effort: Pulmonary effort is normal. No respiratory distress.      Breath sounds: Normal breath sounds. No wheezing.  Abdominal:     General: Bowel sounds are normal. There is no distension.     Palpations: Abdomen is soft.     Tenderness: There is no abdominal tenderness.  Musculoskeletal:        General: No tenderness. Normal range of motion.     Cervical back: Normal range of motion and neck supple.  Skin:    General: Skin is warm and dry.     Findings: No erythema or rash.  Neurological:     Mental Status: He is alert and oriented to person, place, and time.     Cranial Nerves: No cranial nerve deficit.     Deep Tendon Reflexes: Reflexes are normal and symmetric.  Psychiatric:        Behavior: Behavior normal.        Thought Content: Thought content normal.        Judgment: Judgment normal.      BP 139/75   Pulse 65   Temp 98.1 F (36.7 C) (Temporal)   Ht '5\' 11"'  (1.803 m)   Wt 213 lb 8 oz (96.8 kg)   SpO2 97%   BMI 29.78 kg/m      Assessment & Plan:  Nicholas Hawkins comes in today with chief complaint of Medical Management of Chronic Issues and Hypertension   Diagnosis and orders addressed:  1. Annual physical exam - CMP14+EGFR - CBC with Differential/Platelet - Lipid panel - TSH  2. HYPERTENSION, MILD - CMP14+EGFR - CBC with Differential/Platelet  3. Overweight (BMI 25.0-29.9)  - CMP14+EGFR - CBC with Differential/Platelet  4. Depression, major, single episode, mild (HCC) - CMP14+EGFR - CBC with Differential/Platelet  5. Mixed hyperlipidemia - CMP14+EGFR - CBC with Differential/Platelet  6. Bilateral hearing loss, unspecified hearing loss type - CMP14+EGFR - CBC with Differential/Platelet   Labs pending Health Maintenance reviewed Diet and exercise encouraged  Follow up plan: 6 months    Evelina Dun, FNP

## 2021-11-09 LAB — CBC WITH DIFFERENTIAL/PLATELET
Basophils Absolute: 0 10*3/uL (ref 0.0–0.2)
Basos: 1 %
EOS (ABSOLUTE): 0.1 10*3/uL (ref 0.0–0.4)
Eos: 1 %
Hematocrit: 44.2 % (ref 37.5–51.0)
Hemoglobin: 15.1 g/dL (ref 13.0–17.7)
Immature Grans (Abs): 0 10*3/uL (ref 0.0–0.1)
Immature Granulocytes: 0 %
Lymphocytes Absolute: 1.4 10*3/uL (ref 0.7–3.1)
Lymphs: 28 %
MCH: 29.8 pg (ref 26.6–33.0)
MCHC: 34.2 g/dL (ref 31.5–35.7)
MCV: 87 fL (ref 79–97)
Monocytes Absolute: 0.4 10*3/uL (ref 0.1–0.9)
Monocytes: 8 %
Neutrophils Absolute: 3 10*3/uL (ref 1.4–7.0)
Neutrophils: 62 %
Platelets: 186 10*3/uL (ref 150–450)
RBC: 5.06 x10E6/uL (ref 4.14–5.80)
RDW: 12.7 % (ref 11.6–15.4)
WBC: 4.9 10*3/uL (ref 3.4–10.8)

## 2021-11-09 LAB — LIPID PANEL
Chol/HDL Ratio: 6.6 ratio — ABNORMAL HIGH (ref 0.0–5.0)
Cholesterol, Total: 190 mg/dL (ref 100–199)
HDL: 29 mg/dL — ABNORMAL LOW (ref >39)
LDL Chol Calc (NIH): 132 mg/dL — ABNORMAL HIGH (ref 0–99)
Triglycerides: 162 mg/dL — ABNORMAL HIGH (ref 0–149)
VLDL Cholesterol Cal: 29 mg/dL (ref 5–40)

## 2021-11-09 LAB — CMP14+EGFR
ALT: 14 IU/L (ref 0–44)
AST: 17 IU/L (ref 0–40)
Albumin/Globulin Ratio: 1.8 (ref 1.2–2.2)
Albumin: 4.5 g/dL (ref 3.6–4.6)
Alkaline Phosphatase: 124 IU/L — ABNORMAL HIGH (ref 44–121)
BUN/Creatinine Ratio: 17 (ref 10–24)
BUN: 15 mg/dL (ref 8–27)
Bilirubin Total: 0.6 mg/dL (ref 0.0–1.2)
CO2: 26 mmol/L (ref 20–29)
Calcium: 9.5 mg/dL (ref 8.6–10.2)
Chloride: 100 mmol/L (ref 96–106)
Creatinine, Ser: 0.86 mg/dL (ref 0.76–1.27)
Globulin, Total: 2.5 g/dL (ref 1.5–4.5)
Glucose: 101 mg/dL — ABNORMAL HIGH (ref 70–99)
Potassium: 4.4 mmol/L (ref 3.5–5.2)
Sodium: 139 mmol/L (ref 134–144)
Total Protein: 7 g/dL (ref 6.0–8.5)
eGFR: 85 mL/min/{1.73_m2} (ref >59)

## 2021-11-09 LAB — TSH: TSH: 2.82 u[IU]/mL (ref 0.450–4.500)

## 2021-12-12 ENCOUNTER — Other Ambulatory Visit: Payer: Self-pay | Admitting: Family

## 2021-12-12 DIAGNOSIS — I10 Essential (primary) hypertension: Secondary | ICD-10-CM

## 2022-02-14 ENCOUNTER — Ambulatory Visit: Payer: Medicare HMO | Admitting: Nurse Practitioner

## 2022-02-16 ENCOUNTER — Other Ambulatory Visit: Payer: Self-pay | Admitting: Family

## 2022-02-16 DIAGNOSIS — F320 Major depressive disorder, single episode, mild: Secondary | ICD-10-CM

## 2022-04-20 ENCOUNTER — Other Ambulatory Visit: Payer: Self-pay | Admitting: Family

## 2022-05-11 ENCOUNTER — Telehealth (INDEPENDENT_AMBULATORY_CARE_PROVIDER_SITE_OTHER): Payer: Medicare HMO | Admitting: Family

## 2022-05-11 ENCOUNTER — Encounter: Payer: Self-pay | Admitting: Family

## 2022-05-11 DIAGNOSIS — E663 Overweight: Secondary | ICD-10-CM

## 2022-05-11 DIAGNOSIS — I10 Essential (primary) hypertension: Secondary | ICD-10-CM | POA: Diagnosis not present

## 2022-05-11 DIAGNOSIS — E782 Mixed hyperlipidemia: Secondary | ICD-10-CM

## 2022-05-11 DIAGNOSIS — Z6825 Body mass index (BMI) 25.0-25.9, adult: Secondary | ICD-10-CM | POA: Diagnosis not present

## 2022-05-11 DIAGNOSIS — H9193 Unspecified hearing loss, bilateral: Secondary | ICD-10-CM | POA: Diagnosis not present

## 2022-05-11 DIAGNOSIS — F320 Major depressive disorder, single episode, mild: Secondary | ICD-10-CM

## 2022-05-11 MED ORDER — PAROXETINE HCL 40 MG PO TABS
40.0000 mg | ORAL_TABLET | Freq: Every morning | ORAL | 2 refills | Status: DC
Start: 2022-05-11 — End: 2023-09-06

## 2022-05-11 MED ORDER — LISINOPRIL 10 MG PO TABS: ORAL_TABLET | ORAL | 2 refills | Status: DC

## 2022-05-11 NOTE — Progress Notes (Signed)
Virtual Visit Consent   Nicholas Hawkins, you are scheduled for a virtual visit with a [facility] provider today. Just as with appointments in the office, your consent must be obtained to participate. Your consent will be active for this visit and any virtual visit you may have with one of our providers in the next 365 days. If you have a MyChart account, a copy of this consent can be sent to you electronically.  As this is a virtual visit, video technology does not allow for your provider to perform a traditional examination. This may limit your provider's ability to fully assess your condition. If your provider identifies any concerns that need to be evaluated in person or the need to arrange testing (such as labs, EKG, etc.), we will make arrangements to do so. Although advances in technology are sophisticated, we cannot ensure that it will always work on either your end or our end. If the connection with a video visit is poor, the visit may have to be switched to a telephone visit. With either a video or telephone visit, we are not always able to ensure that we have a secure connection.  By engaging in this virtual visit, you consent to the provision of healthcare and authorize for your insurance to be billed (if applicable) for the services provided during this visit. Depending on your insurance coverage, you may receive a charge related to this service.  I need to obtain your verbal consent now. Are you willing to proceed with your visit today? Nicholas Hawkins has provided verbal consent on 05/11/2022 for a virtual visit (video or telephone). Nicholas Dun, FNP  Date: 05/11/2022 1:19 PM  Virtual Visit via Video Note   I, Nicholas Hawkins, connected with  Nicholas Hawkins  ([id number], [birth year]) on 05/11/22 at  8:25 AM EST by a video-enabled telemedicine application and verified that I am speaking with the correct person using two identifiers.  Location: Patient: Virtual Visit Location Patient:  Home Provider: Virtual Visit Location Provider: Office/Clinic   I discussed the limitations of evaluation and management by telemedicine and the availability of in person appointments. The patient expressed understanding and agreed to proceed.    History of Present Illness: Nicholas Hawkins is a 86 y.o. who identifies as a male who was assigned male at birth, and is being seen today for chronic follow up.  HPI: Hypertension This is a chronic problem. The current episode started more than 1 year ago. The problem has been resolved since onset. The problem is controlled. Pertinent negatives include no malaise/fatigue, peripheral edema or shortness of breath. Risk factors for coronary artery disease include obesity, dyslipidemia and male gender. The current treatment provides moderate improvement.  Hyperlipidemia This is a chronic problem. The current episode started more than 1 year ago. The problem is uncontrolled. Pertinent negatives include no shortness of breath. Current antihyperlipidemic treatment includes diet change. The current treatment provides no improvement of lipids. Risk factors for coronary artery disease include dyslipidemia, hypertension and a sedentary lifestyle.  Depression        This is a chronic problem.  The current episode started more than 1 year ago.   The problem occurs intermittently.  Associated symptoms include fatigue and restlessness.  Associated symptoms include no helplessness, no hopelessness, not irritable and not sad.  Past treatments include SSRIs - Selective serotonin reuptake inhibitors.   Problems:  Patient Active Problem List   Diagnosis Date Noted   Depression, major, single episode, mild ([place])  09/09/2020   Tear of lateral meniscus of left knee, current 01/01/2019   Allergic rhinitis due to pollen 10/11/2018   Iliotibial band syndrome affecting lower leg, left 07/30/2018   Pre-diabetes 09/20/2017   Hearing loss 07/14/2013   Hyperlipidemia    Low  testosterone    Paresthesias 03/06/2013   Overweight (BMI 25.0-29.9) 03/01/2010   HYPERTENSION, MILD 03/01/2010   NECK PAIN 03/01/2010    Allergies: No Known Allergies Medications:  Current Outpatient Medications:    ketoconazole (NIZORAL) 2 % cream, Apply 1 application. topically daily., Disp: 45 g, Rfl: 0   lisinopril (ZESTRIL) 10 MG tablet, TAKE ONE (1) TABLET EACH DAY, Disp: 90 tablet, Rfl: 2   PARoxetine (PAXIL) 40 MG tablet, Take 1 tablet (40 mg total) by mouth every morning., Disp: 90 tablet, Rfl: 2  Observations/Objective: Patient is well-developed, well-nourished in no acute distress.  Resting comfortably  at home.  Head is normocephalic, atraumatic.  No labored breathing.  Speech is clear and coherent with logical content.  Patient is alert and oriented at baseline.    Assessment and Plan: 1. HYPERTENSION, MILD - lisinopril (ZESTRIL) 10 MG tablet; TAKE ONE (1) TABLET EACH DAY  Dispense: 90 tablet; Refill: 2  2. Mixed hyperlipidemia  3. Depression, major, single episode, mild (HCC) - PARoxetine (PAXIL) 40 MG tablet; Take 1 tablet (40 mg total) by mouth every morning.  Dispense: 90 tablet; Refill: 2  4. Overweight (BMI 25.0-29.9)  5. Bilateral hearing loss, unspecified hearing loss type  Continue medications  Labs reviewed, stable Follow up in 6 months   Follow Up Instructions: I discussed the assessment and treatment plan with the patient. The patient was provided an opportunity to ask questions and all were answered. The patient agreed with the plan and demonstrated an understanding of the instructions.  A copy of instructions were sent to the patient via MyChart unless otherwise noted below.    The patient was advised to call back or seek an in-person evaluation if the symptoms worsen or if the condition fails to improve as anticipated.  Time:  I spent 21 minutes with the patient via telehealth technology discussing the above problems/concerns.    Nicholas Dun, FNP

## 2022-05-12 ENCOUNTER — Ambulatory Visit (INDEPENDENT_AMBULATORY_CARE_PROVIDER_SITE_OTHER): Payer: Medicare HMO

## 2022-05-12 DIAGNOSIS — Z23 Encounter for immunization: Secondary | ICD-10-CM | POA: Diagnosis not present

## 2022-05-27 ENCOUNTER — Emergency Department (HOSPITAL_COMMUNITY)
Admission: EM | Admit: 2022-05-27 | Discharge: 2022-05-27 | Disposition: A | Discharge status: Home / Self Care | Payer: Medicare HMO | Attending: Emergency Medicine | Admitting: Emergency Medicine

## 2022-05-27 ENCOUNTER — Encounter (HOSPITAL_COMMUNITY): Payer: Self-pay

## 2022-05-27 ENCOUNTER — Emergency Department (HOSPITAL_COMMUNITY): Payer: Medicare HMO

## 2022-05-27 ENCOUNTER — Other Ambulatory Visit: Payer: Self-pay

## 2022-05-27 DIAGNOSIS — S20212A Contusion of left front wall of thorax, initial encounter: Secondary | ICD-10-CM | POA: Diagnosis not present

## 2022-05-27 DIAGNOSIS — S20219A Contusion of unspecified front wall of thorax, initial encounter: Secondary | ICD-10-CM | POA: Diagnosis not present

## 2022-05-27 DIAGNOSIS — J9811 Atelectasis: Secondary | ICD-10-CM | POA: Diagnosis not present

## 2022-05-27 DIAGNOSIS — W19XXXA Unspecified fall, initial encounter: Secondary | ICD-10-CM

## 2022-05-27 DIAGNOSIS — R079 Chest pain, unspecified: Secondary | ICD-10-CM | POA: Diagnosis not present

## 2022-05-27 DIAGNOSIS — W010XXA Fall on same level from slipping, tripping and stumbling without subsequent striking against object, initial encounter: Secondary | ICD-10-CM | POA: Insufficient documentation

## 2022-05-27 DIAGNOSIS — R0781 Pleurodynia: Secondary | ICD-10-CM | POA: Diagnosis present

## 2022-05-27 NOTE — ED Triage Notes (Signed)
Pt was outside when he tripped over a log. Pt c/o bilateral rib pain. Denies any syncope or dizziness as the cause. Denies any LOC. Pt is not on anticoagulants.

## 2022-05-27 NOTE — Discharge Instructions (Signed)
Your x-rays are negative for any acute rib fractures or other chest wall injury.  I recommend continuing using your heating pad as you are doing, taking Advil as needed for pain relief, following the label instructions.

## 2022-05-28 NOTE — ED Provider Notes (Signed)
[facility] EMERGENCY DEPARTMENT Provider Note   CSN: [id number] Arrival date & time: 05/27/22  1158     History  Chief Complaint  Patient presents with   Jackquline Berlin Hobie Kohles is a 86 y.o. male presenting for eval ration of bilateral rib pain which occurred when he tripped over a log 1 week ago.  He was outdoors helping a family member with wood detail further stove when he got tangled in some brush and fell landing against the edge of a log.  He denies any shortness of breath, dizziness, had no syncope or LOC at the time of his fall.  He does have persistent pain bilaterally, right greater than left anterior chest which is triggered by deep inspiration and palpation.  He denies fevers or chills, cough, also denies abdominal pain, nausea or vomiting.  He has taken Tylenol with some symptom relief.  The history is provided by the patient.       Home Medications Prior to Admission medications   Medication Sig Start Date End Date Taking? Authorizing Provider  ketoconazole (NIZORAL) 2 % cream Apply 1 application. topically daily. 08/23/21   Evelina Dun A, FNP  lisinopril (ZESTRIL) 10 MG tablet TAKE ONE (1) TABLET EACH DAY 05/11/22   Hawks, Alyse Low A, FNP  PARoxetine (PAXIL) 40 MG tablet Take 1 tablet (40 mg total) by mouth every morning. 05/11/22   Sharion Balloon, FNP      Allergies    Patient has no known allergies.    Review of Systems   Review of Systems  Constitutional:  Negative for chills, diaphoresis, fatigue and fever.  HENT:  Negative for congestion and sore throat.   Eyes: Negative.   Respiratory:  Negative for chest tightness and shortness of breath.   Cardiovascular:  Positive for chest pain.  Gastrointestinal:  Negative for abdominal pain, nausea and vomiting.  Genitourinary: Negative.   Musculoskeletal:  Negative for arthralgias, joint swelling and neck pain.  Skin: Negative.  Negative for rash and wound.  Neurological:  Negative for dizziness, weakness,  light-headedness, numbness and headaches.  Psychiatric/Behavioral: Negative.    All other systems reviewed and are negative.   Physical Exam Updated Vital Signs BP 137/86   Pulse 71   Temp 98.2 F (36.8 C) (Oral)   Resp (!) 1   Ht 6' (1.829 m)   Wt 83.9 kg   SpO2 97%   BMI 25.09 kg/m  Physical Exam Vitals and nursing note reviewed.  Constitutional:      Appearance: He is well-developed.  HENT:     Head: Normocephalic and atraumatic.  Eyes:     Conjunctiva/sclera: Conjunctivae normal.  Cardiovascular:     Rate and Rhythm: Normal rate and regular rhythm.     Heart sounds: Normal heart sounds.  Pulmonary:     Effort: Pulmonary effort is normal.     Breath sounds: Normal breath sounds. No wheezing or rhonchi.  Chest:     Chest wall: Tenderness present.       Comments: Tenderness to palpation bilateral anterior ribs.  No palpable deformity or crepitus.  There is a faint small healing bruise at the right area of tenderness.  Abdominal:     General: Bowel sounds are normal.     Palpations: Abdomen is soft.     Tenderness: There is no abdominal tenderness. There is no guarding.  Musculoskeletal:        General: Normal range of motion.     Cervical back: Normal  range of motion.  Skin:    General: Skin is warm and dry.  Neurological:     Mental Status: He is alert.     ED Results / Procedures / Treatments   Labs (all labs ordered are listed, but only abnormal results are displayed) Labs Reviewed - No data to display  EKG None  Radiology DG Chest 2 View  Result Date: 05/27/2022 CLINICAL DATA:  Chest pain after fall EXAM: CHEST - 2 VIEW COMPARISON:  04/09/2018 FINDINGS: The heart size and mediastinal contours are within normal limits. Aortic atherosclerosis. Mild bibasilar atelectasis. No pleural effusion or pneumothorax. No acute displaced rib fracture is evident on the included projections. IMPRESSION: Mild bibasilar atelectasis. Otherwise, no acute cardiopulmonary  findings. Electronically Signed   By: Davina Poke D.O.   On: 05/27/2022 12:29    Procedures Procedures    Medications Ordered in ED Medications - No data to display  ED Course/ Medical Decision Making/ A&P                           Medical Decision Making Patient presenting with continued bilateral rib cage pain which is reproducible on my exam.  He is in no respiratory distress, plain film images are negative for fractures and his exam is reassuring.  We discussed the possibility of subacute fracture, although since this injury occurred 1 week ago I think this is less likely since there is no evidence of healing ribs on today's imaging.  Therefore I do suspect that this is rib contusion.  We discussed home treatment for this condition, encouraged follow-up with this primary provider if his symptoms are not continuing to improve over the next week to 10 days, patient does state that he feels some better today compared to at the time of the initial injury.  He has no abdominal pain, no guarding to suggest  an abdominal injury.  Amount and/or Complexity of Data Reviewed Radiology: ordered.           Final Clinical Impression(s) / ED Diagnoses Final diagnoses:  Fall, initial encounter  Contusion of rib, unspecified laterality, initial encounter    Rx / DC Orders ED Discharge Orders     None         Landis Martins 05/28/22 1831    Carmin Muskrat, MD 05/29/22 1102

## 2022-06-27 DIAGNOSIS — L570 Actinic keratosis: Secondary | ICD-10-CM | POA: Diagnosis not present

## 2022-06-27 DIAGNOSIS — C44222 Squamous cell carcinoma of skin of right ear and external auricular canal: Secondary | ICD-10-CM | POA: Diagnosis not present

## 2022-06-27 DIAGNOSIS — D485 Neoplasm of uncertain behavior of skin: Secondary | ICD-10-CM | POA: Diagnosis not present

## 2022-07-06 DIAGNOSIS — C44229 Squamous cell carcinoma of skin of left ear and external auricular canal: Secondary | ICD-10-CM | POA: Diagnosis not present

## 2022-08-22 ENCOUNTER — Telehealth: Payer: Self-pay | Admitting: Family

## 2022-08-22 NOTE — Telephone Encounter (Signed)
Contacted Kiser Kuruc to schedule their annual wellness visit. Appointment made for 08/24/2022.  Thank you,  Colletta Maryland,  [place] [facility] Direct Dial ??[phone]

## 2022-08-24 ENCOUNTER — Telehealth: Payer: Self-pay | Admitting: Family

## 2022-08-24 NOTE — Telephone Encounter (Signed)
Contacted Kaden Johl to schedule their annual wellness visit. Appointment made for 09/04/2022.  Thank you,  Colletta Maryland,  [place] [facility] Direct Dial ??[phone]

## 2022-09-04 ENCOUNTER — Ambulatory Visit (INDEPENDENT_AMBULATORY_CARE_PROVIDER_SITE_OTHER): Payer: Medicare HMO

## 2022-09-04 VITALS — Ht 72.0 in | Wt 200.0 lb

## 2022-09-04 DIAGNOSIS — Z0000 Encounter for general adult medical examination without abnormal findings: Secondary | ICD-10-CM | POA: Diagnosis not present

## 2022-09-04 DIAGNOSIS — Z0100 Encounter for examination of eyes and vision without abnormal findings: Secondary | ICD-10-CM

## 2022-09-04 NOTE — Patient Instructions (Signed)
[name] , Thank you for taking time to come for your Medicare Wellness Visit. I appreciate your ongoing commitment to your health goals. Please review the following plan we discussed and let me know if I can assist you in the future.   These are the goals we discussed:  Goals      Exercise 3x per week (30 min per time)     Have 3 meals a day        This is a list of the screening recommended for you and due dates:  Health Maintenance  Topic Date Due   COVID-19 Vaccine (1) Never done   DTaP/Tdap/Td vaccine (3 - Td or Tdap) 10/04/2022   Medicare Annual Wellness Visit  09/04/2023   Pneumonia Vaccine  Completed   Flu Shot  Completed   Zoster (Shingles) Vaccine  Completed   HPV Vaccine  Aged Out    Advanced directives: Advance directive discussed with you today. I have provided a copy for you to complete at home and have notarized. Once this is complete please bring a copy in to our office so we can scan it into your chart.   Conditions/risks identified: Aim for 30 minutes of exercise or brisk walking, 6-8 glasses of water, and 5 servings of fruits and vegetables each day.   Next appointment: Follow up in one year for your annual wellness visit.   Preventive Care 63 Years and Older, Male  Preventive care refers to lifestyle choices and visits with your health care provider that can promote health and wellness. What does preventive care include? A yearly physical exam. This is also called an annual well check. Dental exams once or twice a year. Routine eye exams. Ask your health care provider how often you should have your eyes checked. Personal lifestyle choices, including: Daily care of your teeth and gums. Regular physical activity. Eating a healthy diet. Avoiding tobacco and drug use. Limiting alcohol use. Practicing safe sex. Taking low doses of aspirin every day. Taking vitamin and mineral supplements as recommended by your health care provider. What happens during an  annual well check? The services and screenings done by your health care provider during your annual well check will depend on your age, overall health, lifestyle risk factors, and family history of disease. Counseling  Your health care provider may ask you questions about your: Alcohol use. Tobacco use. Drug use. Emotional well-being. Home and relationship well-being. Sexual activity. Eating habits. History of falls. Memory and ability to understand (cognition). Work and work Statistician. Screening  You may have the following tests or measurements: Height, weight, and BMI. Blood pressure. Lipid and cholesterol levels. These may be checked every 5 years, or more frequently if you are over 63 years old. Skin check. Lung cancer screening. You may have this screening every year starting at age 42 if you have a 30-pack-year history of smoking and currently smoke or have quit within the past 15 years. Fecal occult blood test (FOBT) of the stool. You may have this test every year starting at age 3. Flexible sigmoidoscopy or colonoscopy. You may have a sigmoidoscopy every 5 years or a colonoscopy every 10 years starting at age 56. Prostate cancer screening. Recommendations will vary depending on your family history and other risks. Hepatitis C blood test. Hepatitis B blood test. Sexually transmitted disease (STD) testing. Diabetes screening. This is done by checking your blood sugar (glucose) after you have not eaten for a while (fasting). You may have this done every  1-3 years. Abdominal aortic aneurysm (AAA) screening. You may need this if you are a current or former smoker. Osteoporosis. You may be screened starting at age 35 if you are at high risk. Talk with your health care provider about your test results, treatment options, and if necessary, the need for more tests. Vaccines  Your health care provider may recommend certain vaccines, such as: Influenza vaccine. This is recommended  every year. Tetanus, diphtheria, and acellular pertussis (Tdap, Td) vaccine. You may need a Td booster every 10 years. Zoster vaccine. You may need this after age 12. Pneumococcal 13-valent conjugate (PCV13) vaccine. One dose is recommended after age 54. Pneumococcal polysaccharide (PPSV23) vaccine. One dose is recommended after age 33. Talk to your health care provider about which screenings and vaccines you need and how often you need them. This information is not intended to replace advice given to you by your health care provider. Make sure you discuss any questions you have with your health care provider. Document Released: 06/25/2015 Document Revised: 02/16/2016 Document Reviewed: 03/30/2015 Elsevier Interactive Patient Education  2017 [place] Prevention in the Home Falls can cause injuries. They can happen to people of all ages. There are many things you can do to make your home safe and to help prevent falls. What can I do on the outside of my home? Regularly fix the edges of walkways and driveways and fix any cracks. Remove anything that might make you trip as you walk through a door, such as a raised step or threshold. Trim any bushes or trees on the path to your home. Use bright outdoor lighting. Clear any walking paths of anything that might make someone trip, such as rocks or tools. Regularly check to see if handrails are loose or broken. Make sure that both sides of any steps have handrails. Any raised decks and porches should have guardrails on the edges. Have any leaves, snow, or ice cleared regularly. Use sand or salt on walking paths during winter. Clean up any spills in your garage right away. This includes oil or grease spills. What can I do in the bathroom? Use night lights. Install grab bars by the toilet and in the tub and shower. Do not use towel bars as grab bars. Use non-skid mats or decals in the tub or shower. If you need to sit down in the shower,  use a plastic, non-slip stool. Keep the floor dry. Clean up any water that spills on the floor as soon as it happens. Remove soap buildup in the tub or shower regularly. Attach bath mats securely with double-sided non-slip rug tape. Do not have throw rugs and other things on the floor that can make you trip. What can I do in the bedroom? Use night lights. Make sure that you have a light by your bed that is easy to reach. Do not use any sheets or blankets that are too big for your bed. They should not hang down onto the floor. Have a firm chair that has side arms. You can use this for support while you get dressed. Do not have throw rugs and other things on the floor that can make you trip. What can I do in the kitchen? Clean up any spills right away. Avoid walking on wet floors. Keep items that you use a lot in easy-to-reach places. If you need to reach something above you, use a strong step stool that has a grab bar. Keep electrical cords out of the way.  Do not use floor polish or wax that makes floors slippery. If you must use wax, use non-skid floor wax. Do not have throw rugs and other things on the floor that can make you trip. What can I do with my stairs? Do not leave any items on the stairs. Make sure that there are handrails on both sides of the stairs and use them. Fix handrails that are broken or loose. Make sure that handrails are as long as the stairways. Check any carpeting to make sure that it is firmly attached to the stairs. Fix any carpet that is loose or worn. Avoid having throw rugs at the top or bottom of the stairs. If you do have throw rugs, attach them to the floor with carpet tape. Make sure that you have a light switch at the top of the stairs and the bottom of the stairs. If you do not have them, ask someone to add them for you. What else can I do to help prevent falls? Wear shoes that: Do not have high heels. Have rubber bottoms. Are comfortable and fit you  well. Are closed at the toe. Do not wear sandals. If you use a stepladder: Make sure that it is fully opened. Do not climb a closed stepladder. Make sure that both sides of the stepladder are locked into place. Ask someone to hold it for you, if possible. Clearly mark and make sure that you can see: Any grab bars or handrails. First and last steps. Where the edge of each step is. Use tools that help you move around (mobility aids) if they are needed. These include: Canes. Walkers. Scooters. Crutches. Turn on the lights when you go into a dark area. Replace any light bulbs as soon as they burn out. Set up your furniture so you have a clear path. Avoid moving your furniture around. If any of your floors are uneven, fix them. If there are any pets around you, be aware of where they are. Review your medicines with your doctor. Some medicines can make you feel dizzy. This can increase your chance of falling. Ask your doctor what other things that you can do to help prevent falls. This information is not intended to replace advice given to you by your health care provider. Make sure you discuss any questions you have with your health care provider. Document Released: 03/25/2009 Document Revised: 11/04/2015 Document Reviewed: 07/03/2014 Elsevier Interactive Patient Education  2017 [employer].

## 2022-09-04 NOTE — Progress Notes (Signed)
Subjective:   Nicholas Hawkins is a 87 y.o. male who presents for Medicare Annual/Subsequent preventive examination. I connected with  Nicholas Hawkins on 09/04/22 by a audio enabled telemedicine application and verified that I am speaking with the correct person using two identifiers.  Patient Location: Home  Provider Location: Home Office  I discussed the limitations of evaluation and management by telemedicine. The patient expressed understanding and agreed to proceed.  Review of Systems     Cardiac Risk Factors include: advanced age (>41men, >62 women);male gender;hypertension     Objective:    Today's Vitals   09/04/22 1441  Weight: 200 lb (90.7 kg)  Height: 6' (1.829 m)   Body mass index is 27.12 kg/m.     09/04/2022    2:45 PM 05/27/2022   12:04 PM 07/11/2021   10:12 AM 08/27/2017    3:15 PM 05/09/2017    3:56 PM 04/30/2015    3:11 PM  Advanced Directives  Does Patient Have a Medical Advance Directive? No Yes Yes No Yes No  Type of Corporate treasurer of [place];Living will [place];Living will  [place];Living will   Copy of [place] in Chart?   No - copy requested  No - copy requested   Would patient like information on creating a medical advance directive? No - Patient declined   No - Patient declined      Current Medications (verified) Outpatient Encounter Medications as of 09/04/2022  Medication Sig   ketoconazole (NIZORAL) 2 % cream Apply 1 application. topically daily.   lisinopril (ZESTRIL) 10 MG tablet TAKE ONE (1) TABLET EACH DAY   PARoxetine (PAXIL) 40 MG tablet Take 1 tablet (40 mg total) by mouth every morning.   No facility-administered encounter medications on file as of 09/04/2022.    Allergies (verified) Patient has no known allergies.   History: Past Medical History:  Diagnosis Date   Hyperlipidemia    HYPERTENSION, MILD    Low testosterone    Obesity     Obesity, unspecified    Past Surgical History:  Procedure Laterality Date   APPENDECTOMY     KNEE SURGERY Right    Family History  Problem Relation Age of Onset   Heart disease Mother    Early death Sister    Aneurysm Brother    Social History   Socioeconomic History   Marital status: Married    Spouse name: Not on file   Number of children: 2   Years of education: Not on file   Highest education level: 8th grade  Occupational History   Not on file  Tobacco Use   Smoking status: Never   Smokeless tobacco: Never  Vaping Use   Vaping Use: Never used  Substance and Sexual Activity   Alcohol use: No   Drug use: No   Sexual activity: Never  Other Topics Concern   Not on file  Social History Narrative   Not on file   Social Determinants of Health   Financial Resource Strain: Low Risk  (09/04/2022)   Overall Financial Resource Strain (CARDIA)    Difficulty of Paying Living Expenses: Not hard at all  Food Insecurity: No Food Insecurity (09/04/2022)   Hunger Vital Sign    Worried About Running Out of Food in the Last Year: Never true    Ran Out of Food in the Last Year: Never true  Transportation Needs: No Transportation Needs (09/04/2022)   PRAPARE -  Hydrologist (Medical): No    Lack of Transportation (Non-Medical): No  Physical Activity: Insufficiently Active (09/04/2022)   Exercise Vital Sign    Days of Exercise per Week: 3 days    Minutes of Exercise per Session: 30 min  Stress: No Stress Concern Present (09/04/2022)   [place]    Feeling of Stress : Not at all  Social Connections: Moderately Integrated (09/04/2022)   Social Connection and Isolation Panel [NHANES]    Frequency of Communication with Friends and Family: More than three times a week    Frequency of Social Gatherings with Friends and Family: More than three times a week    Attends Religious Services: More  than 4 times per year    Active Member of [employer] or Organizations: No    Attends Music therapist: Never    Marital Status: Married    Tobacco Counseling Counseling given: Not Answered   Clinical Intake:  Pre-visit preparation completed: Yes  Pain : No/denies pain     Nutritional Risks: None Diabetes: No  How often do you need to have someone help you when you read instructions, pamphlets, or other written materials from your doctor or pharmacy?: 1 - Never  Diabetic?no   Interpreter Needed?: No  Information entered by :: Jadene Pierini, LPN   Activities of Daily Living    09/04/2022    2:45 PM  In your present state of health, do you have any difficulty performing the following activities:  Hearing? 0  Vision? 0  Difficulty concentrating or making decisions? 0  Walking or climbing stairs? 0  Dressing or bathing? 0  Doing errands, shopping? 0  Preparing Food and eating ? N  Using the Toilet? N  In the past six months, have you accidently leaked urine? N  Do you have problems with loss of bowel control? N  Managing your Medications? N  Managing your Finances? N  Housekeeping or managing your Housekeeping? N    Patient Care Team: Sharion Balloon, FNP as PCP - General (Family Medicine)  Indicate any recent Medical Services you may have received from other than [facility] providers in the past year (date may be approximate).     Assessment:   This is a routine wellness examination for [facility].  Hearing/Vision screen Vision Screening - Comments:: Referral 09/04/2022  Dietary issues and exercise activities discussed: Current Exercise Habits: Home exercise routine, Type of exercise: walking, Time (Minutes): 30, Frequency (Times/Week): 3, Weekly Exercise (Minutes/Week): 90, Intensity: Mild, Exercise limited by: None identified   Goals Addressed   None    Depression Screen    09/04/2022    2:44 PM 11/08/2021    8:59 AM 08/23/2021    9:55 AM 07/26/2021     9:38 AM 07/11/2021   10:08 AM 03/18/2021   11:21 AM 09/09/2020   11:24 AM  PHQ 2/9 Scores  PHQ - 2 Score 0 0 0 0 0 0 0  PHQ- 9 Score  1 0   0 0    Fall Risk    09/04/2022    2:43 PM 11/08/2021    8:58 AM 08/23/2021    9:55 AM 07/26/2021    9:38 AM 07/11/2021   10:14 AM  Fall Risk   Falls in the past year? 0 1 1 1  0  Number falls in past yr: 0 0  0 0  Injury with Fall? 0 0  0 0  Risk for fall due to : No Fall Risks History of fall(s)   No Fall Risks  Follow up Falls prevention discussed Falls evaluation completed  Falls prevention discussed Falls prevention discussed    FALL RISK PREVENTION PERTAINING TO THE HOME:  Any stairs in or around the home? No  If so, are there any without handrails? No  Home free of loose throw rugs in walkways, pet beds, electrical cords, etc? Yes  Adequate lighting in your home to reduce risk of falls? Yes   ASSISTIVE DEVICES UTILIZED TO PREVENT FALLS:  Life alert? No  Use of a cane, walker or w/c? No  Grab bars in the bathroom? No  Shower chair or bench in shower? No  Elevated toilet seat or a handicapped toilet? No       05/09/2017    4:25 PM  MMSE - Mini Mental State Exam  Not completed: Unable to complete        09/04/2022    2:45 PM 07/11/2021   10:19 AM  6CIT Screen  What Year? 0 points 0 points  What month? 0 points 0 points  What time? 0 points 0 points  Count back from 20 0 points 0 points  Months in reverse 0 points 4 points  Repeat phrase 2 points 10 points  Total Score 2 points 14 points    Immunizations Immunization History  Administered Date(s) Administered   Fluad Quad(high Dose 65+) 04/29/2019, 03/30/2020, 05/02/2021, 05/12/2022   Influenza, High Dose Seasonal PF 03/22/2017, 03/06/2018   Influenza,inj,Quad PF,6+ Mos 03/06/2013, 04/07/2014, 04/30/2015, 03/22/2016   Influenza-Unspecified 03/30/2009, 04/02/2012   Pneumococcal Conjugate-13 07/14/2013   Pneumococcal Polysaccharide-23 03/30/2020    Pneumococcal-Unspecified 06/12/2005   Td 10/03/2012   Tdap 10/03/2012   Zoster Recombinat (Shingrix) 04/29/2019, 07/26/2021    TDAP status: Up to date  Flu Vaccine status: Up to date  Pneumococcal vaccine status: Up to date  Covid-19 vaccine status: Completed vaccines  Qualifies for Shingles Vaccine? Yes   Zostavax completed Yes   Shingrix Completed?: Yes  Screening Tests Health Maintenance  Topic Date Due   COVID-19 Vaccine (1) Never done   DTaP/Tdap/Td (3 - Td or Tdap) 10/04/2022   Medicare Annual Wellness (AWV)  09/04/2023   Pneumonia Vaccine 85+ Years old  Completed   INFLUENZA VACCINE  Completed   Zoster Vaccines- Shingrix  Completed   HPV VACCINES  Aged Out    Health Maintenance  Health Maintenance Due  Topic Date Due   COVID-19 Vaccine (1) Never done    Colorectal cancer screening: No longer required.   Lung Cancer Screening: (Low Dose CT Chest recommended if Age 75-80 years, 30 pack-year currently smoking OR have quit w/in 15years.) does not qualify.   Lung Cancer Screening Referral: n/a  Additional Screening:  Hepatitis C Screening: does not qualify;   Vision Screening: Recommended annual ophthalmology exams for early detection of glaucoma and other disorders of the eye. Is the patient up to date with their annual eye exam?  No  Who is the provider or what is the name of the office in which the patient attends annual eye exams? None referral 09/04/2022 If pt is not established with a provider, would they like to be referred to a provider to establish care? No .   Dental Screening: Recommended annual dental exams for proper oral hygiene  Community Resource Referral / Chronic Care Management: CRR required this visit?  No   CCM required this visit?  No      Plan:  I have personally reviewed and noted the following in the patient's chart:   Medical and social history Use of alcohol, tobacco or illicit drugs  Current medications and  supplements including opioid prescriptions. Patient is not currently taking opioid prescriptions. Functional ability and status Nutritional status Physical activity Advanced directives List of other physicians Hospitalizations, surgeries, and ER visits in previous 12 months Vitals Screenings to include cognitive, depression, and falls Referrals and appointments  In addition, I have reviewed and discussed with patient certain preventive protocols, quality metrics, and best practice recommendations. A written personalized care plan for preventive services as well as general preventive health recommendations were provided to patient.     Daphane Shepherd, LPN   [id number]   Nurse Notes: Due Tdap Vaccine in 09/2022

## 2022-10-16 DIAGNOSIS — R051 Acute cough: Secondary | ICD-10-CM | POA: Diagnosis not present

## 2022-10-16 DIAGNOSIS — J0100 Acute maxillary sinusitis, unspecified: Secondary | ICD-10-CM | POA: Diagnosis not present

## 2023-01-24 DIAGNOSIS — D485 Neoplasm of uncertain behavior of skin: Secondary | ICD-10-CM | POA: Diagnosis not present

## 2023-01-24 DIAGNOSIS — L570 Actinic keratosis: Secondary | ICD-10-CM | POA: Diagnosis not present

## 2023-01-24 DIAGNOSIS — Z1283 Encounter for screening for malignant neoplasm of skin: Secondary | ICD-10-CM | POA: Diagnosis not present

## 2023-01-24 DIAGNOSIS — Z85828 Personal history of other malignant neoplasm of skin: Secondary | ICD-10-CM | POA: Diagnosis not present

## 2023-04-03 DIAGNOSIS — B349 Viral infection, unspecified: Secondary | ICD-10-CM | POA: Diagnosis not present

## 2023-04-03 DIAGNOSIS — Z20822 Contact with and (suspected) exposure to covid-19: Secondary | ICD-10-CM | POA: Diagnosis not present

## 2023-05-07 ENCOUNTER — Ambulatory Visit: Payer: Medicare HMO

## 2023-05-08 ENCOUNTER — Ambulatory Visit (INDEPENDENT_AMBULATORY_CARE_PROVIDER_SITE_OTHER): Payer: Medicare HMO | Admitting: Nurse Practitioner

## 2023-05-08 ENCOUNTER — Encounter: Payer: Self-pay | Admitting: Nurse Practitioner

## 2023-05-08 VITALS — BP 147/72 | HR 71 | Temp 98.0°F | Resp 20 | Ht 72.0 in | Wt 208.0 lb

## 2023-05-08 DIAGNOSIS — R42 Dizziness and giddiness: Secondary | ICD-10-CM

## 2023-05-08 MED ORDER — PREDNISONE 20 MG PO TABS: 40.0000 mg | ORAL_TABLET | Freq: Every day | ORAL | 0 refills | Status: AC

## 2023-05-08 NOTE — Progress Notes (Signed)
   Subjective:    Patient ID: Nicholas Hawkins, male    DOB: 1935/12/17, 87 y.o.   MRN: [id number]  He says when he stands up he feels "swimmy headed". Once he gets going he is okay. He has taken meclizine. He is better today then he was 2 days. Currently not dizzy.  Dizziness This is a new problem. The current episode started in the past 7 days. The problem occurs intermittently. The problem has been waxing and waning. Pertinent negatives include no congestion, headaches, nausea or vomiting. He has tried nothing for the symptoms. The treatment provided mild relief.      Review of Systems  HENT:  Negative for congestion.   Gastrointestinal:  Negative for nausea and vomiting.  Neurological:  Positive for dizziness. Negative for headaches.       Objective:   Physical Exam Constitutional:      Appearance: Normal appearance.  Cardiovascular:     Rate and Rhythm: Normal rate and regular rhythm.     Heart sounds: Normal heart sounds.  Pulmonary:     Breath sounds: Normal breath sounds.  Skin:    General: Skin is warm.  Neurological:     General: No focal deficit present.     Mental Status: He is alert and oriented to person, place, and time.     Cranial Nerves: No cranial nerve deficit.     Sensory: No sensory deficit.  Psychiatric:        Mood and Affect: Mood normal.        Behavior: Behavior normal.    BP (!) 147/72   Pulse 71   Temp 98 F (36.7 C) (Temporal)   Resp 20   Ht 6' (1.829 m)   Wt 208 lb (94.3 kg)   SpO2 98%   BMI 28.21 kg/m         Assessment & Plan:   Nicholas Hawkins in today with chief complaint of Nasal Congestion   1. Vertigo Rise slowly from sitting to standing Rest Meclizine as needed - predniSONE (DELTASONE) 20 MG tablet; Take 2 tablets (40 mg total) by mouth daily with breakfast for 5 days. 2 po daily for 5 days  Dispense: 10 tablet; Refill: 0    The above assessment and management plan was discussed with the patient. The patient  verbalized understanding of and has agreed to the management plan. Patient is aware to call the clinic if symptoms persist or worsen. Patient is aware when to return to the clinic for a follow-up visit. Patient educated on when it is appropriate to go to the emergency department.   Mary-Margaret Daphine Deutscher, FNP

## 2023-05-08 NOTE — Patient Instructions (Signed)
Vertigo Vertigo is the feeling that you or the things around you are moving when they are not. This feeling can come and go at any time. Vertigo often goes away on its own. This condition can be dangerous if it happens when you are doing activities like driving or working with machines. Your doctor will do tests to find the cause of your vertigo. These tests will also help your doctor decide on the best treatment for you. Follow these instructions at home: Eating and drinking     Drink enough fluid to keep your pee (urine) pale yellow. Do not drink alcohol. Activity Return to your normal activities when your doctor says that it is safe. In the morning, first sit up on the side of the bed. When you feel okay, stand slowly while you hold onto something until you know that your balance is fine. Move slowly. Avoid sudden body or head movements or certain positions, as told by your doctor. Use a cane if you have trouble standing or walking. Sit down right away if you feel dizzy. Avoid doing any tasks or activities that can cause danger to you or others if you get dizzy. Avoid bending down if you feel dizzy. Place items in your home so that they are easy for you to reach without bending or leaning over. Do not drive or use machinery if you feel dizzy. General instructions Take over-the-counter and prescription medicines only as told by your doctor. Keep all follow-up visits. Contact a doctor if: Your medicine does not help your vertigo. Your problems get worse or you have new symptoms. You have a fever. You feel like you may vomit (nauseous), or this feeling gets worse. You start to vomit. Your family or friends see changes in how you act. You lose feeling (have numbness) in part of your body. You feel prickling and tingling in a part of your body. Get help right away if: You are always dizzy. You faint. You get very bad headaches. You get a stiff neck. Bright light starts to bother  you. You have trouble moving or talking. You feel weak in your hands, arms, or legs. You have changes in your hearing or in how you see (vision). These symptoms may be an emergency. Get help right away. Call your local emergency services (911 in the [place] Do not wait to see if the symptoms will go away. Do not drive yourself to the hospital. Summary Vertigo is the feeling that you or the things around you are moving when they are not. Your doctor will do tests to find the cause of your vertigo. You may be told to avoid some tasks, positions, or movements. Contact a doctor if your medicine is not helping, or if you have a fever, new symptoms, or a change in how you act. Get help right away if you get very bad headaches, or if you have changes in how you speak, hear, or see. This information is not intended to replace advice given to you by your health care provider. Make sure you discuss any questions you have with your health care provider. Document Revised: 04/28/2020 Document Reviewed: 04/28/2020 Elsevier Patient Education  2024 [employer].

## 2023-06-04 ENCOUNTER — Ambulatory Visit: Payer: Self-pay | Admitting: Family

## 2023-06-04 NOTE — Telephone Encounter (Signed)
  Chief Complaint: dog bite Symptoms: dog bite with puncture wounds, daughter called and has not laid eyes on wound Pertinent Negatives: Patient denies fever, red streaks Disposition: [] ED /[x] Urgent Care (no appt availability in office) / [] Appointment(In office/virtual)/ []  [facility]/ [] Home Care/ [] Refused Recommended Disposition /[] [facility]/ []  Follow-up with PCP Additional Notes: Patient's daughter called in and stated patient got bit by his hunting dog that was caught in a trap because dog was scared. Dog is known to have rabies shot. Patient's daughter has not laid eyes on dog bite but was reported it was puncture wounds in hand with broken skin. Advised patient to go to Urgent Care to get assessed @ Urgent [facility] @ [place] (closest location to patient's home). Patient's daughter stated there is a closer location at [facility] but asked if Jannifer Rodney NP can call in prophylactic antibiotic. I stated patient would likely need to be examined but I will pass through high priority message to Ms. Hawks. Advised patient to monitor for s/s infection including fever, red streaks and to keep it clean and covered. Advised patient's daughter if patient needs stitches or feels worse he needs to be seen at emergency room immediately.    Copied from CRM [id number]. Topic: Clinical - Red Word Triage >> Jun 04, 2023  5:20 PM Thomes Dinning wrote: Red Word that prompted transfer to Nurse Triage: Dog Bite. Skin has been broken. Reason for Disposition  [1] Puncture wound or small cut AND [2] on hands or genitals  (Exception: Puncture  from small pet such as gerbil, mouse, hamster, puppy or turtle.)  Answer Assessment - Initial Assessment Questions 1. ANIMAL: "What type of animal caused the bite?" "Is the injury from a bite or a claw?" If the animal is a dog or a cat, ask: "Was it a pet or a stray?" "Was it acting ill or behaving strangely?"     Dog - his pet dog 2. LOCATION: "Where is the bite  located?"      Hand 3. SIZE: "How big is the bite?" "What does it look like?"      Puncture wounds  4. ONSET: "When did the bite happen?" (Minutes or hours ago)      An hour ago 5. CIRCUMSTANCES: "Tell me how this happened."      He was hunting, the dog got in a trap and he was trying to get the dog out and the dog panicked 6. TETANUS: "When was your last tetanus booster?"     Unsure  7. RABIES VACCINE: For dog or cat bites, ask: "Do you know if the pet is vaccinated against rabies?"  (e.g., yes, no, overdue for rabies shot, unknown)     Yes  Protocols used: Animal Bite-A-AH

## 2023-07-10 ENCOUNTER — Telehealth: Payer: Self-pay | Admitting: Nurse Practitioner

## 2023-07-10 MED ORDER — OSELTAMIVIR PHOSPHATE 75 MG PO CAPS: 75.0000 mg | ORAL_CAPSULE | Freq: Every day | ORAL | 0 refills | Status: AC

## 2023-07-10 NOTE — Telephone Encounter (Signed)
Daughter called in stating sister and his wife have both had flu. Would like meds  to prevent.  Meds ordered this encounter  Medications   oseltamivir (TAMIFLU) 75 MG capsule    Sig: Take 1 capsule (75 mg total) by mouth daily for 10 days.    Dispense:  10 capsule    Refill:  0    Supervising Provider:   Arville Care A [[id number]]   Mary-Margaret Daphine Deutscher, FNP

## 2023-08-08 DIAGNOSIS — H31001 Unspecified chorioretinal scars, right eye: Secondary | ICD-10-CM | POA: Diagnosis not present

## 2023-08-08 DIAGNOSIS — H43812 Vitreous degeneration, left eye: Secondary | ICD-10-CM | POA: Diagnosis not present

## 2023-08-08 DIAGNOSIS — H02831 Dermatochalasis of right upper eyelid: Secondary | ICD-10-CM | POA: Diagnosis not present

## 2023-08-08 DIAGNOSIS — H02834 Dermatochalasis of left upper eyelid: Secondary | ICD-10-CM | POA: Diagnosis not present

## 2023-08-08 DIAGNOSIS — H25811 Combined forms of age-related cataract, right eye: Secondary | ICD-10-CM | POA: Diagnosis not present

## 2023-08-08 DIAGNOSIS — Z961 Presence of intraocular lens: Secondary | ICD-10-CM | POA: Diagnosis not present

## 2023-08-20 DIAGNOSIS — L814 Other melanin hyperpigmentation: Secondary | ICD-10-CM | POA: Diagnosis not present

## 2023-08-20 DIAGNOSIS — L570 Actinic keratosis: Secondary | ICD-10-CM | POA: Diagnosis not present

## 2023-08-20 DIAGNOSIS — Z85828 Personal history of other malignant neoplasm of skin: Secondary | ICD-10-CM | POA: Diagnosis not present

## 2023-09-05 ENCOUNTER — Other Ambulatory Visit: Payer: Self-pay | Admitting: Family

## 2023-09-05 DIAGNOSIS — F320 Major depressive disorder, single episode, mild: Secondary | ICD-10-CM

## 2023-09-05 NOTE — Progress Notes (Signed)
 This encounter was created in error - please disregard.

## 2023-09-06 MED ORDER — PAROXETINE HCL 40 MG PO TABS: 40.0000 mg | ORAL_TABLET | Freq: Every morning | ORAL | 0 refills | Status: DC

## 2023-09-06 NOTE — Addendum Note (Signed)
 Addended by: Julious Payer D on: 09/06/2023 09:33 AM   Modules accepted: Orders

## 2023-09-06 NOTE — Telephone Encounter (Signed)
 Hawks NTBS last OV 05/11/22 NO RF sent to pharmacy last OV greater than a year

## 2023-09-06 NOTE — Telephone Encounter (Signed)
 Scheduled appt for April 17

## 2023-09-25 DIAGNOSIS — H2640 Unspecified secondary cataract: Secondary | ICD-10-CM | POA: Diagnosis not present

## 2023-09-25 DIAGNOSIS — H11153 Pinguecula, bilateral: Secondary | ICD-10-CM | POA: Diagnosis not present

## 2023-09-25 DIAGNOSIS — H2511 Age-related nuclear cataract, right eye: Secondary | ICD-10-CM | POA: Diagnosis not present

## 2023-09-27 ENCOUNTER — Ambulatory Visit: Admitting: Family

## 2023-09-27 ENCOUNTER — Encounter: Payer: Self-pay | Admitting: Family

## 2023-09-27 VITALS — BP 156/75 | HR 42 | Temp 97.1°F | Ht 72.0 in | Wt 214.4 lb

## 2023-09-27 DIAGNOSIS — E663 Overweight: Secondary | ICD-10-CM

## 2023-09-27 DIAGNOSIS — H9193 Unspecified hearing loss, bilateral: Secondary | ICD-10-CM

## 2023-09-27 DIAGNOSIS — F5101 Primary insomnia: Secondary | ICD-10-CM

## 2023-09-27 DIAGNOSIS — I10 Essential (primary) hypertension: Secondary | ICD-10-CM | POA: Diagnosis not present

## 2023-09-27 DIAGNOSIS — Z0000 Encounter for general adult medical examination without abnormal findings: Secondary | ICD-10-CM

## 2023-09-27 DIAGNOSIS — R7303 Prediabetes: Secondary | ICD-10-CM | POA: Diagnosis not present

## 2023-09-27 DIAGNOSIS — F320 Major depressive disorder, single episode, mild: Secondary | ICD-10-CM | POA: Diagnosis not present

## 2023-09-27 DIAGNOSIS — I4510 Unspecified right bundle-branch block: Secondary | ICD-10-CM | POA: Diagnosis not present

## 2023-09-27 DIAGNOSIS — I4892 Unspecified atrial flutter: Secondary | ICD-10-CM

## 2023-09-27 DIAGNOSIS — E782 Mixed hyperlipidemia: Secondary | ICD-10-CM

## 2023-09-27 DIAGNOSIS — Z23 Encounter for immunization: Secondary | ICD-10-CM

## 2023-09-27 DIAGNOSIS — R001 Bradycardia, unspecified: Secondary | ICD-10-CM | POA: Diagnosis not present

## 2023-09-27 DIAGNOSIS — Z0001 Encounter for general adult medical examination with abnormal findings: Secondary | ICD-10-CM | POA: Diagnosis not present

## 2023-09-27 LAB — CMP14+EGFR
ALT: 12 IU/L (ref 0–44)
AST: 19 IU/L (ref 0–40)
Albumin: 4.8 g/dL — ABNORMAL HIGH (ref 3.7–4.7)
Alkaline Phosphatase: 151 IU/L — ABNORMAL HIGH (ref 44–121)
BUN/Creatinine Ratio: 16 (ref 10–24)
BUN: 16 mg/dL (ref 8–27)
Bilirubin Total: 0.6 mg/dL (ref 0.0–1.2)
CO2: 25 mmol/L (ref 20–29)
Calcium: 9.3 mg/dL (ref 8.6–10.2)
Chloride: 97 mmol/L (ref 96–106)
Creatinine, Ser: 0.99 mg/dL (ref 0.76–1.27)
Globulin, Total: 2.2 g/dL (ref 1.5–4.5)
Glucose: 94 mg/dL (ref 70–99)
Potassium: 4.9 mmol/L (ref 3.5–5.2)
Sodium: 137 mmol/L (ref 134–144)
Total Protein: 7 g/dL (ref 6.0–8.5)
eGFR: 74 mL/min/{1.73_m2} (ref >59)

## 2023-09-27 LAB — CBC WITH DIFFERENTIAL/PLATELET
Basophils Absolute: 0 10*3/uL (ref 0.0–0.2)
Basos: 1 %
EOS (ABSOLUTE): 0.1 10*3/uL (ref 0.0–0.4)
Eos: 2 %
Hematocrit: 46.9 % (ref 37.5–51.0)
Hemoglobin: 15.6 g/dL (ref 13.0–17.7)
Immature Grans (Abs): 0 10*3/uL (ref 0.0–0.1)
Immature Granulocytes: 0 %
Lymphocytes Absolute: 1.3 10*3/uL (ref 0.7–3.1)
Lymphs: 30 %
MCH: 30 pg (ref 26.6–33.0)
MCHC: 33.3 g/dL (ref 31.5–35.7)
MCV: 90 fL (ref 79–97)
Monocytes Absolute: 0.5 10*3/uL (ref 0.1–0.9)
Monocytes: 10 %
Neutrophils Absolute: 2.5 10*3/uL (ref 1.4–7.0)
Neutrophils: 57 %
Platelets: 189 10*3/uL (ref 150–450)
RBC: 5.2 x10E6/uL (ref 4.14–5.80)
RDW: 13.1 % (ref 11.6–15.4)
WBC: 4.4 10*3/uL (ref 3.4–10.8)

## 2023-09-27 LAB — LIPID PANEL
Chol/HDL Ratio: 7.2 ratio — ABNORMAL HIGH (ref 0.0–5.0)
Cholesterol, Total: 202 mg/dL — ABNORMAL HIGH (ref 100–199)
HDL: 28 mg/dL — ABNORMAL LOW (ref >39)
LDL Chol Calc (NIH): 129 mg/dL — ABNORMAL HIGH (ref 0–99)
Triglycerides: 251 mg/dL — ABNORMAL HIGH (ref 0–149)
VLDL Cholesterol Cal: 45 mg/dL — ABNORMAL HIGH (ref 5–40)

## 2023-09-27 LAB — TSH: TSH: 2.87 u[IU]/mL (ref 0.450–4.500)

## 2023-09-27 LAB — BAYER DCA HB A1C WAIVED: HB A1C (BAYER DCA - WAIVED): 5.5 % (ref 4.8–5.6)

## 2023-09-27 MED ORDER — TRAZODONE HCL 50 MG PO TABS: 50.0000 mg | ORAL_TABLET | Freq: Every day | ORAL | 1 refills | Status: DC

## 2023-09-27 MED ORDER — PAROXETINE HCL 40 MG PO TABS: 40.0000 mg | ORAL_TABLET | Freq: Every morning | ORAL | 3 refills | Status: AC

## 2023-09-27 MED ORDER — LISINOPRIL 20 MG PO TABS
20.0000 mg | ORAL_TABLET | Freq: Every day | ORAL | 3 refills | Status: DC
Start: 2023-09-27 — End: 2023-12-24

## 2023-09-27 MED ORDER — LISINOPRIL 10 MG PO TABS: ORAL_TABLET | ORAL | 2 refills | Status: DC

## 2023-09-27 MED ORDER — ASPIRIN 81 MG PO TBEC: 81.0000 mg | DELAYED_RELEASE_TABLET | Freq: Every day | ORAL | 1 refills | Status: DC

## 2023-09-27 NOTE — Patient Instructions (Signed)
 Insomnia Insomnia is a sleep disorder that makes it difficult to fall asleep or stay asleep. Insomnia can cause fatigue, low energy, difficulty concentrating, mood swings, and poor performance at work or school. There are three different ways to classify insomnia: Difficulty falling asleep. Difficulty staying asleep. Waking up too early in the morning. Any type of insomnia can be long-term (chronic) or short-term (acute). Both are common. Short-term insomnia usually lasts for 3 months or less. Chronic insomnia occurs at least three times a week for longer than 3 months. What are the causes? Insomnia may be caused by another condition, situation, or substance, such as: Having certain mental health conditions, such as anxiety and depression. Using caffeine, alcohol, tobacco, or drugs. Having gastrointestinal conditions, such as gastroesophageal reflux disease (GERD). Having certain medical conditions. These include: Asthma. Alzheimer's disease. Stroke. Chronic pain. An overactive thyroid gland (hyperthyroidism). Other sleep disorders, such as restless legs syndrome and sleep apnea. Menopause. Sometimes, the cause of insomnia may not be known. What increases the risk? Risk factors for insomnia include: Gender. Females are affected more often than males. Age. Insomnia is more common as people get older. Stress and certain medical and mental health conditions. Lack of exercise. Having an irregular work schedule. This may include working night shifts and traveling between different time zones. What are the signs or symptoms? If you have insomnia, the main symptom is having trouble falling asleep or having trouble staying asleep. This may lead to other symptoms, such as: Feeling tired or having low energy. Feeling nervous about going to sleep. Not feeling rested in the morning. Having trouble concentrating. Feeling irritable, anxious, or depressed. How is this diagnosed? This condition  may be diagnosed based on: Your symptoms and medical history. Your health care provider may ask about: Your sleep habits. Any medical conditions you have. Your mental health. A physical exam. How is this treated? Treatment for insomnia depends on the cause. Treatment may focus on treating an underlying condition that is causing the insomnia. Treatment may also include: Medicines to help you sleep. Counseling or therapy. Lifestyle adjustments to help you sleep better. Follow these instructions at home: Eating and drinking  Limit or avoid alcohol, caffeinated beverages, and products that contain nicotine and tobacco, especially close to bedtime. These can disrupt your sleep. Do not eat a large meal or eat spicy foods right before bedtime. This can lead to digestive discomfort that can make it hard for you to sleep. Sleep habits  Keep a sleep diary to help you and your health care provider figure out what could be causing your insomnia. Write down: When you sleep. When you wake up during the night. How well you sleep and how rested you feel the next day. Any side effects of medicines you are taking. What you eat and drink. Make your bedroom a dark, comfortable place where it is easy to fall asleep. Put up shades or blackout curtains to block light from outside. Use a white noise machine to block noise. Keep the temperature cool. Limit screen use before bedtime. This includes: Not watching TV. Not using your smartphone, tablet, or computer. Stick to a routine that includes going to bed and waking up at the same times every day and night. This can help you fall asleep faster. Consider making a quiet activity, such as reading, part of your nighttime routine. Try to avoid taking naps during the day so that you sleep better at night. Get out of bed if you are still awake after  15 minutes of trying to sleep. Keep the lights down, but try reading or doing a quiet activity. When you feel  sleepy, go back to bed. General instructions Take over-the-counter and prescription medicines only as told by your health care provider. Exercise regularly as told by your health care provider. However, avoid exercising in the hours right before bedtime. Use relaxation techniques to manage stress. Ask your health care provider to suggest some techniques that may work well for you. These may include: Breathing exercises. Routines to release muscle tension. Visualizing peaceful scenes. Make sure that you drive carefully. Do not drive if you feel very sleepy. Keep all follow-up visits. This is important. Contact a health care provider if: You are tired throughout the day. You have trouble in your daily routine due to sleepiness. You continue to have sleep problems, or your sleep problems get worse. Get help right away if: You have thoughts about hurting yourself or someone else. Get help right away if you feel like you may hurt yourself or others, or have thoughts about taking your own life. Go to your nearest emergency room or: Call 911. Call the National Suicide Prevention Lifeline at (906)021-1611 or 988. This is open 24 hours a day. Text the Crisis Text Line at 325-069-2793. Summary Insomnia is a sleep disorder that makes it difficult to fall asleep or stay asleep. Insomnia can be long-term (chronic) or short-term (acute). Treatment for insomnia depends on the cause. Treatment may focus on treating an underlying condition that is causing the insomnia. Keep a sleep diary to help you and your health care provider figure out what could be causing your insomnia. This information is not intended to replace advice given to you by your health care provider. Make sure you discuss any questions you have with your health care provider. Document Revised: 05/09/2021 Document Reviewed: 05/09/2021 Elsevier Patient Education  2024 [employer].

## 2023-09-27 NOTE — Progress Notes (Signed)
 Subjective:    Patient ID: Nicholas Hawkins, male    DOB: 01-Dec-1935, 88 y.o.   MRN: [id number]  Chief Complaint  Patient presents with   Medical Management of Chronic Issues   Insomnia   Pt presents to the office today for CPE.   His BP is elevated today. Reports he took his medication today.   He has been prediabetic in the past.  Hypertension This is a chronic problem. The current episode started more than 1 year ago. The problem has been waxing and waning since onset. The problem is uncontrolled. Associated symptoms include malaise/fatigue. Pertinent negatives include no peripheral edema or shortness of breath. Risk factors for coronary artery disease include dyslipidemia, obesity and male gender. The current treatment provides moderate improvement.  Hyperlipidemia This is a chronic problem. The current episode started more than 1 year ago. The problem is uncontrolled. Recent lipid tests were reviewed and are high. He has no history of obesity. Pertinent negatives include no shortness of breath. Current antihyperlipidemic treatment includes diet change. The current treatment provides no improvement of lipids. Risk factors for coronary artery disease include dyslipidemia, male sex, hypertension and a sedentary lifestyle.  Depression        This is a chronic problem.  The current episode started more than 1 year ago.   Associated symptoms include insomnia and sad.  Associated symptoms include no helplessness, no hopelessness and not irritable.  Past treatments include SSRIs - Selective serotonin reuptake inhibitors. Insomnia Primary symptoms: difficulty falling asleep, frequent awakening, malaise/fatigue.   The current episode started more than one year. The problem occurs intermittently. The treatment provided no relief.      Review of Systems  Constitutional:  Positive for malaise/fatigue.  Respiratory:  Negative for shortness of breath.   Psychiatric/Behavioral:  The patient has  insomnia.   All other systems reviewed and are negative.   Family History  Problem Relation Age of Onset   Heart disease Mother    Early death Sister    Aneurysm Brother    Social History   Socioeconomic History   Marital status: Married    Spouse name: Not on file   Number of children: 2   Years of education: Not on file   Highest education level: 11th grade  Occupational History   Not on file  Tobacco Use   Smoking status: Never   Smokeless tobacco: Never  Vaping Use   Vaping status: Never Used  Substance and Sexual Activity   Alcohol use: No   Drug use: No   Sexual activity: Never  Other Topics Concern   Not on file  Social History Narrative   Not on file   Social Drivers of Health   Financial Resource Strain: Low Risk  (09/20/2023)   Overall Financial Resource Strain (CARDIA)    Difficulty of Paying Living Expenses: Not very hard  Food Insecurity: No Food Insecurity (09/20/2023)   Hunger Vital Sign    Worried About Running Out of Food in the Last Year: Never true    Ran Out of Food in the Last Year: Never true  Transportation Needs: No Transportation Needs (09/20/2023)   PRAPARE - Administrator, Civil Service (Medical): No    Lack of Transportation (Non-Medical): No  Physical Activity: Sufficiently Active (09/20/2023)   Exercise Vital Sign    Days of Exercise per Week: 5 days    Minutes of Exercise per Session: 30 min  Stress: Stress Concern Present (09/20/2023)  [employer] of Occupational Health - Occupational Stress Questionnaire    Feeling of Stress : To some extent  Social Connections: Moderately Isolated (09/20/2023)   Social Connection and Isolation Panel [NHANES]    Frequency of Communication with Friends and Family: More than three times a week    Frequency of Social Gatherings with Friends and Family: More than three times a week    Attends Religious Services: Never    Database administrator or Organizations: No    Attends Museum/gallery exhibitions officer: Not on file    Marital Status: Married       Objective:   Physical Exam Vitals reviewed.  Constitutional:      General: He is not irritable.He is not in acute distress.    Appearance: He is well-developed.  HENT:     Head: Normocephalic.     Right Ear: Tympanic membrane normal.     Left Ear: Tympanic membrane normal.     Ears:     Comments: HOH  Eyes:     General:        Right eye: No discharge.        Left eye: No discharge.     Pupils: Pupils are equal, round, and reactive to light.  Neck:     Thyroid: No thyromegaly.  Cardiovascular:     Rate and Rhythm: Normal rate. Rhythm irregular.     Heart sounds: Normal heart sounds. No murmur heard. Pulmonary:     Effort: Pulmonary effort is normal. No respiratory distress.     Breath sounds: Normal breath sounds. No wheezing.  Abdominal:     General: Bowel sounds are normal. There is no distension.     Palpations: Abdomen is soft.     Tenderness: There is no abdominal tenderness.  Musculoskeletal:        General: No tenderness. Normal range of motion.     Cervical back: Normal range of motion and neck supple.  Skin:    General: Skin is warm and dry.     Findings: No erythema or rash.  Neurological:     Mental Status: He is alert and oriented to person, place, and time.     Cranial Nerves: No cranial nerve deficit.     Deep Tendon Reflexes: Reflexes are normal and symmetric.  Psychiatric:        Behavior: Behavior normal.        Thought Content: Thought content normal.        Judgment: Judgment normal.       BP (!) 156/75   Pulse (!) 42   Temp (!) 97.1 F (36.2 C) (Temporal)   Ht 6' (1.829 m)   Wt 214 lb 6.4 oz (97.3 kg)   SpO2 95%   BMI 29.08 kg/m      Assessment & Plan:  Treyten Monestime comes in today with chief complaint of Medical Management of Chronic Issues and Insomnia   Diagnosis and orders addressed:  1. HYPERTENSION, MILD Will increase Lisinopril to 20 mg from 10 mg   -Daily blood pressure log given with instructions on how to fill out and told to bring to next visit -Dash diet information given -Exercise encouraged - Stress Management  -Continue current meds -RTO in 2 weeks  - CMP14+EGFR - EKG 12-Lead - lisinopril (ZESTRIL) 20 MG tablet; Take 1 tablet (20 mg total) by mouth daily.  Dispense: 90 tablet; Refill: 3  2. Depression, major, single episode, mild (HCC) - PARoxetine (PAXIL) 40  MG tablet; Take 1 tablet (40 mg total) by mouth every morning.  Dispense: 90 tablet; Refill: 3 - CMP14+EGFR  3. Annual physical exam (Primary) - Bayer DCA Hb A1c Waived - CMP14+EGFR - CBC with Differential/Platelet - Lipid panel - TSH  4. Overweight (BMI 25.0-29.9) - CMP14+EGFR  5. Mixed hyperlipidemia - CMP14+EGFR - Lipid panel  6. Bilateral hearing loss, unspecified hearing loss type - CMP14+EGFR  7. Prediabetes - Bayer DCA Hb A1c Waived - CMP14+EGFR  8. Primary insomnia Will add Trazodone 50-100 mg  Sleep ritual  Fall precautions  - traZODone (DESYREL) 50 MG tablet; Take 1-2 tablets (50-100 mg total) by mouth at bedtime.  Dispense: 180 tablet; Refill: 1  9. Bradycardia - EKG 12-Lead  10. Right bundle branch block - Ambulatory referral to Cardiology  11. Atrial flutter, unspecified type (HCC) -Start aspirin  Referral to Cardiologists pending  If having any chest pain or SOB go to ED - TSH - aspirin EC 81 MG tablet; Take 1 tablet (81 mg total) by mouth daily. Swallow whole.  Dispense: 90 tablet; Refill: 1 - Ambulatory referral to Cardiology    Labs pending Will increase lisinopril to 20 mg from 10 mg Will add trazodone 50-100 mg for sleep Added aspirin 81 mg Referral to Cardiologists pending  Continue current medications  Health Maintenance reviewed Diet and exercise encouraged  Follow up plan: 2 weeks for HTN, Atrial Flutter/Fib   Tommas Fragmin, FNP

## 2023-10-02 ENCOUNTER — Other Ambulatory Visit: Payer: Self-pay | Admitting: Family

## 2023-10-09 ENCOUNTER — Ambulatory Visit (INDEPENDENT_AMBULATORY_CARE_PROVIDER_SITE_OTHER): Admitting: Family

## 2023-10-09 ENCOUNTER — Encounter: Payer: Self-pay | Admitting: Family

## 2023-10-09 VITALS — BP 122/68 | HR 73 | Temp 97.6°F | Ht 72.0 in | Wt 212.6 lb

## 2023-10-09 DIAGNOSIS — I10 Essential (primary) hypertension: Secondary | ICD-10-CM

## 2023-10-09 DIAGNOSIS — I4892 Unspecified atrial flutter: Secondary | ICD-10-CM | POA: Diagnosis not present

## 2023-10-09 DIAGNOSIS — F5101 Primary insomnia: Secondary | ICD-10-CM | POA: Insufficient documentation

## 2023-10-09 NOTE — Patient Instructions (Signed)

## 2023-10-09 NOTE — Progress Notes (Signed)
 Subjective:    Patient ID: Nicholas Hawkins, male    DOB: [birth year], 88 y.o.   MRN: [id number]  Chief Complaint  Patient presents with   Follow-up    Follow up plan: 2 weeks for HTN, Atrial Flutter/Fib      PT presents to the office today with follow up HTN. He was seen on 09/27/23 and increased his Lisinopril  to 20 mg from 10 mg. His BP is at goal today.   We also add trazodone  50 mg for insomnia. States this has been working well!  He had an EKG that showed possible A flutter/fib and we placed referral to Cardiologists. Started on 81 mg aspirin .  Hypertension This is a chronic problem. The current episode started more than 1 year ago. The problem has been resolved since onset. The problem is controlled. Pertinent negatives include no malaise/fatigue, peripheral edema or shortness of breath. Past treatments include ACE inhibitors. The current treatment provides moderate improvement.  Insomnia Primary symptoms: difficulty falling asleep, frequent awakening, no malaise/fatigue.   The current episode started more than one year. The onset quality is gradual. The problem occurs intermittently. Past treatments include medication. The treatment provided moderate relief.      Review of Systems  Constitutional:  Negative for malaise/fatigue.  Respiratory:  Negative for shortness of breath.   Psychiatric/Behavioral:  The patient has insomnia.   All other systems reviewed and are negative.   Social History   Socioeconomic History   Marital status: Married    Spouse name: Not on file   Number of children: 2   Years of education: Not on file   Highest education level: 11th grade  Occupational History   Not on file  Tobacco Use   Smoking status: Never   Smokeless tobacco: Never  Vaping Use   Vaping status: Never Used  Substance and Sexual Activity   Alcohol use: No   Drug use: No   Sexual activity: Never  Other Topics Concern   Not on file  Social History Narrative   Not on  file   Social Drivers of Health   Financial Resource Strain: Low Risk  (09/20/2023)   Overall Financial Resource Strain (CARDIA)    Difficulty of Paying Living Expenses: Not very hard  Food Insecurity: No Food Insecurity (09/20/2023)   Hunger Vital Sign    Worried About Running Out of Food in the Last Year: Never true    Ran Out of Food in the Last Year: Never true  Transportation Needs: No Transportation Needs (09/20/2023)   PRAPARE - Administrator, Civil Service (Medical): No    Lack of Transportation (Non-Medical): No  Physical Activity: Sufficiently Active (09/20/2023)   Exercise Vital Sign    Days of Exercise per Week: 5 days    Minutes of Exercise per Session: 30 min  Stress: Stress Concern Present (09/20/2023)   [facility] of Occupational Health - Occupational Stress Questionnaire    Feeling of Stress : To some extent  Social Connections: Moderately Isolated (09/20/2023)   Social Connection and Isolation Panel [NHANES]    Frequency of Communication with Friends and Family: More than three times a week    Frequency of Social Gatherings with Friends and Family: More than three times a week    Attends Religious Services: Never    Database administrator or Organizations: No    Attends Engineer, structural: Not on file    Marital Status: Married   Family History  Problem Relation Age of Onset   Heart disease Mother    Early death Sister    Aneurysm Brother         Objective:   Physical Exam Vitals reviewed.  Constitutional:      General: He is not in acute distress.    Appearance: He is well-developed.  HENT:     Head: Normocephalic.     Right Ear: Tympanic membrane normal.     Left Ear: Tympanic membrane normal.  Eyes:     General:        Right eye: No discharge.        Left eye: No discharge.     Pupils: Pupils are equal, round, and reactive to light.  Neck:     Thyroid : No thyromegaly.  Cardiovascular:     Rate and Rhythm: Normal  rate and regular rhythm.     Heart sounds: Normal heart sounds. No murmur heard. Pulmonary:     Effort: Pulmonary effort is normal. No respiratory distress.     Breath sounds: Normal breath sounds. No wheezing.  Abdominal:     General: Bowel sounds are normal. There is no distension.     Palpations: Abdomen is soft.     Tenderness: There is no abdominal tenderness.  Musculoskeletal:        General: No tenderness. Normal range of motion.     Cervical back: Normal range of motion and neck supple.  Skin:    General: Skin is warm and dry.     Findings: No erythema or rash.  Neurological:     Mental Status: He is alert and oriented to person, place, and time.     Cranial Nerves: No cranial nerve deficit.     Deep Tendon Reflexes: Reflexes are normal and symmetric.  Psychiatric:        Behavior: Behavior normal.        Thought Content: Thought content normal.        Judgment: Judgment normal.       BP 122/68   Pulse 73   Temp 97.6 F (36.4 C) (Temporal)   Ht 6' (1.829 m)   Wt 212 lb 9.6 oz (96.4 kg)   SpO2 95%   BMI 28.83 kg/m      Assessment & Plan:  [name] comes in today with chief complaint of Follow-up (Follow up plan:/2 weeks for HTN, Atrial Flutter/Fib/ /)   Diagnosis and orders addressed:  1. HYPERTENSION, MILD (Primary) At goal  -Daily blood pressure log given with instructions on how to fill out and told to bring to next visit -Dash diet information given -Exercise encouraged - Stress Management  -Continue current meds - CMP14+EGFR  2. Primary insomnia Continue trazodone   Sleep ritual  - CMP14+EGFR  3. Atrial flutter, unspecified type (HCC) I have checked on referral to Cardiologists  Continue aspirin  - CMP14+EGFR   Labs pending Continue current medications  Keep follow up with specialists  Health Maintenance reviewed Diet and exercise encouraged  Return in about 6 months (around 04/09/2024), or if symptoms worsen or fail to  improve.    Tommas Fragmin, FNP

## 2023-10-10 LAB — CMP14+EGFR
ALT: 11 IU/L (ref 0–44)
AST: 15 IU/L (ref 0–40)
Albumin: 4.3 g/dL (ref 3.7–4.7)
Alkaline Phosphatase: 126 IU/L — ABNORMAL HIGH (ref 44–121)
BUN/Creatinine Ratio: 21 (ref 10–24)
BUN: 21 mg/dL (ref 8–27)
Bilirubin Total: 0.4 mg/dL (ref 0.0–1.2)
CO2: 24 mmol/L (ref 20–29)
Calcium: 9.2 mg/dL (ref 8.6–10.2)
Chloride: 102 mmol/L (ref 96–106)
Creatinine, Ser: 0.98 mg/dL (ref 0.76–1.27)
Globulin, Total: 2 g/dL (ref 1.5–4.5)
Glucose: 97 mg/dL (ref 70–99)
Potassium: 4.1 mmol/L (ref 3.5–5.2)
Sodium: 140 mmol/L (ref 134–144)
Total Protein: 6.3 g/dL (ref 6.0–8.5)
eGFR: 75 mL/min/{1.73_m2} (ref >59)

## 2023-10-24 NOTE — Progress Notes (Unsigned)
 Cardiology Office Note   Date:  10/25/2023   ID:  Murry Christman, DOB 04-Mar-1936, MRN [id number]  PCP:  Yevette Hem, FNP  Cardiologist:   Eilleen Grates, MD Referring:  Yevette Hem, FNP  No chief complaint on file.    History of Present Illness: Nicholas Hawkins is a 88 y.o. male who presents for for evaluation of atrial fibrillation and RBBB.  He was in this on April 17 when he was seen for regular follow-up.  He had a strange warm sensation in his left upper chest but he did not feel palpitations.  He was not having any presyncope or syncope.  He was not having any chest pressure, neck or arm discomfort.  He has no shortness of breath, PND or orthopnea.  He is very active.  He is out in the yard most of the time.  He has a little walking path he does.  He does a lot of chores.  He has no symptoms related to this.  I saw him in 2014 for chest pain and abnormal EKG.  He did not require further cardiac work up.     Past Medical History:  Diagnosis Date   Hyperlipidemia    HYPERTENSION, MILD    Low testosterone     Obesity, unspecified     Past Surgical History:  Procedure Laterality Date   APPENDECTOMY     KNEE SURGERY Right      Current Outpatient Medications  Medication Sig Dispense Refill   apixaban (ELIQUIS) 5 MG TABS tablet Take 1 tablet (5 mg total) by mouth 2 (two) times daily. 60 tablet 11   Colchicine  0.6 MG CAPS Take 0.6 mg by mouth twice a day for 4 days and then STOP 8 capsule 0   ketoconazole  (NIZORAL ) 2 % cream Apply 1 application. topically daily. 45 g 0   lisinopril  (ZESTRIL ) 20 MG tablet Take 1 tablet (20 mg total) by mouth daily. 90 tablet 3   PARoxetine  (PAXIL ) 40 MG tablet Take 1 tablet (40 mg total) by mouth every morning. 90 tablet 3   traZODone  (DESYREL ) 50 MG tablet Take 1-2 tablets (50-100 mg total) by mouth at bedtime. 180 tablet 1   No current facility-administered medications for this visit.    Allergies:   Patient has no known  allergies.    Social History:  The patient  reports that he has never smoked. He has never used smokeless tobacco. He reports that he does not drink alcohol and does not use drugs.   Family History:  The patient's family history includes Aneurysm in his brother; Early death in his sister; Heart disease in his mother.    ROS:  Please see the history of present illness.   Otherwise, review of systems are positive for recurrent gout in his right great toe.   All other systems are reviewed and negative.    PHYSICAL EXAM: VS:  BP 136/82   Pulse 76   Ht 6' (1.829 m)   Wt 208 lb (94.3 kg)   SpO2 96%   BMI 28.21 kg/m  , BMI Body mass index is 28.21 kg/m. GENERAL:  Well appearing HEENT:  Pupils equal round and reactive, fundi not visualized, oral mucosa unremarkable NECK:  No jugular venous distention, waveform within normal limits, carotid upstroke brisk and symmetric, no bruits, no thyromegaly LYMPHATICS:  No cervical, inguinal adenopathy LUNGS:  Clear to auscultation bilaterally BACK:  No CVA tenderness CHEST:  Unremarkable HEART:  PMI not displaced  or sustained,S1 and S2 within normal limits, no S3, no S4, no clicks, no rubs, no murmurs ABD:  Flat, positive bowel sounds normal in frequency in pitch, no bruits, no rebound, no guarding, no midline pulsatile mass, no hepatomegaly, no splenomegaly EXT:  2 plus pulses throughout, no edema, no cyanosis no clubbing SKIN:  No rashes no nodules NEURO:  Cranial nerves II through XII grossly intact, motor grossly intact throughout [facility]:  Cognitively intact, oriented to person place and time    EKG:  EKG Interpretation Date/Time:  Thursday Oct 25 2023 11:03:12 EDT Ventricular Rate:  76 PR Interval:  200 QRS Duration:  128 QT Interval:  400 QTC Calculation: 450 R Axis:   -67  Text Interpretation: Sinus rhythm with Premature atrial complexes Left axis deviation Non-specific intra-ventricular conduction block When compared with ECG of  27-Aug-2017 17:17, No significant change since last tracing Confirmed by Eilleen Grates ([id number]) on 10/25/2023 11:22:24 AM     Recent Labs: 09/27/2023: Hemoglobin 15.6; Platelets 189; TSH 2.870 10/09/2023: ALT 11; BUN 21; Creatinine, Ser 0.98; Potassium 4.1; Sodium 140    Lipid Panel    Component Value Date/Time   CHOL 202 (H) 09/27/2023 1056   TRIG 251 (H) 09/27/2023 1056   TRIG 227 (H) 09/18/2014 0946   HDL 28 (L) 09/27/2023 1056   HDL 21 (L) 09/18/2014 0946   CHOLHDL 7.2 (H) 09/27/2023 1056   LDLCALC 129 (H) 09/27/2023 1056   LDLCALC 93 03/06/2013 1327      Wt Readings from Last 3 Encounters:  10/25/23 208 lb (94.3 kg)  10/09/23 212 lb 9.6 oz (96.4 kg)  09/27/23 214 lb 6.4 oz (97.3 kg)      Other studies Reviewed: Additional studies/ records that were reviewed today include: Labs, primary care office notes. Review of the above records demonstrates:  Please see elsewhere in the note.     ASSESSMENT AND PLAN:  RBBB: He has no bradycardic symptoms.  No change in therapy other than as below.  Atrial fib: He is back in sinus today.  He has asymptomatic paroxysmal atrial fibrillation.  I am going to do a 2-week monitor to look at the burden of this but even so I think he is probably having asymptomatic episodes more than we know.  His CHA2DS2-VASc score is 3.  We discussed risk benefits of anticoagulation and he agrees to start Eliquis and stop his aspirin .  I suspect he has reasonable rate control send no med changes there.  I will check an echocardiogram.  I will check a CBC in about a month.   Gout: He is having some gout and so I will give him about a 4-day course of twice daily colchicine .   Current medicines are reviewed at length with the patient today.  The patient does not have concerns regarding medicines.  The following changes have been made:  no change  Labs/ tests ordered today include:   Orders Placed This Encounter  Procedures   CBC   LONG TERM MONITOR  (3-14 DAYS)   EKG 12-Lead   ECHOCARDIOGRAM COMPLETE     Disposition:   FU with with me or APP in six months.      Signed, Eilleen Grates, MD  10/25/2023 12:00 PM    [facility]

## 2023-10-25 ENCOUNTER — Encounter: Payer: Self-pay | Admitting: Cardiology

## 2023-10-25 ENCOUNTER — Ambulatory Visit: Attending: Cardiology

## 2023-10-25 ENCOUNTER — Ambulatory Visit (INDEPENDENT_AMBULATORY_CARE_PROVIDER_SITE_OTHER): Admitting: Cardiology

## 2023-10-25 VITALS — BP 136/82 | HR 76 | Ht 72.0 in | Wt 208.0 lb

## 2023-10-25 DIAGNOSIS — I4891 Unspecified atrial fibrillation: Secondary | ICD-10-CM

## 2023-10-25 MED ORDER — COLCHICINE 0.6 MG PO CAPS: ORAL_CAPSULE | ORAL | 0 refills | Status: AC

## 2023-10-25 MED ORDER — APIXABAN 5 MG PO TABS: 5.0000 mg | ORAL_TABLET | Freq: Two times a day (BID) | ORAL | 11 refills | Status: AC

## 2023-10-25 NOTE — H&P (Signed)
 Surgical History & Physical  Patient Name: Nicholas Hawkins  DOB: 02-15-1936  Surgery: Cataract extraction with intraocular lens implant phacoemulsification; Right Eye Surgeon: Ardeth Krabbe MD Surgery Date: 11/02/2023 Pre-Op Date: 09/25/2023  HPI: A 78 Yr. old male patient 1. The patient is here for a cataract eval. Ref: Dr. Lincoln Renshaw. Pt. complains of difficulty when driving due to glare from headlights or sun. The right eye is affected. The episode is constant. Near vision is worse than distance. The complaint is associated with blurry vision. This This is negatively affecting the patient's quality of life and the patient is unable to function adequately in life with the current level of vision. HPI was performed by Ardeth Krabbe .  Medical History:  Anxiety High Blood Pressure  Review of Systems Cardiovascular High Blood Pressure Psychiatry Anxiety All recorded systems are negative except as noted above.  Social Never smoked  Medication Prednisolone-moxiflox-bromfen,  Oseltamivir , Paroxetine  HCl  Sx/Procedures Cat Sx OS,  Knee Surgery  Drug Allergies  NKDA  History & Physical: Heent: cataracts NECK: supple without bruits LUNGS: lungs clear to auscultation CV: regular rate and rhythm Abdomen: soft and non-tender  Impression & Plan: Assessment: 1.  CATARACT NUCLEAR SCLEROSIS AGE RELATED; Right Eye (H25.11) 2.  POSTERIOR CAPSULAR OPACIFICATION ; Left Eye (H26.40) 3.  Pinguecula; Both Eyes (H11.153) 4.  PSEUDOPHAKIA-POST OP (PRESENCE OF INTRAOCULAR LENS); Left Eye (Z98.42) 5.  KERATOCONJUNCTIVITIS SICCA NOT SPECIFIED AS SJORGRENS; Both Eyes (H16.223) 6.  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  Cataracts are visually significant and account for the patient's complaints. Discussed all risks, benefits, procedures and recovery, including infection, loss of vision and eye, need for glasses after surgery or additional procedures. Patient understands  changing glasses will not improve vision. Patient indicated understanding of procedure. All questions answered. Patient desires to have surgery, recommend phacoemulsification with intraocular lens. Patient to have preliminary testing necessary (Argos/IOL Master, Mac OCT, TOPO) Educational materials provided:Cataract.  Plan: - Proceed with cataract surgery OD - Plan for best distance target with DIB00 - No DM, no fuchs, no prior eye surgery - mod dilation and Pseudoexfoliation - Dextenza   2.  Not bothersome to patient at this time, monitor  3.  ATs PRN  4.  Doing well, performed at [facility] years ago.  5.  Dry eye. Mild signs of dry eye at this time. Can use artificial tears QID OU PRN and warm compresses once daily as needed.  6.  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 Monitor closely for now  Pseudoexfoliation diagnosis discussed with patient. Tiny fibers are produced on several organs but only affect the eyes. There is an increase risk of raised intraocular pressure (glaucoma), poor dilation, and weakened lens zonules. Explained that zonules are the suspension cables that hold the lens in place. This increases risk of complications during cataract surgery such as vitreous loss or insufficient stability to place an intraocular lens (IOL). Even with an uneventful surgery there is increased risk for IOL dislocation in the future.

## 2023-10-25 NOTE — Patient Instructions (Addendum)
 Medication Instructions:   STOP Aspirin    START Eliquis 5 mg twice a day   Take Colchicine  0.6 mg twice a day for 4 days and then stop    Labwork: CBC in 2 months  Testing/Procedures: Your physician has requested that you have an echocardiogram. Echocardiography is a painless test that uses sound waves to create images of your heart. It provides your doctor with information about the size and shape of your heart and how well your heart's chambers and valves are working. This procedure takes approximately one hour. There are no restrictions for this procedure. Please do NOT wear cologne, perfume, aftershave, or lotions (deodorant is allowed). Please arrive 15 minutes prior to your appointment time.  Please note: We ask at that you not bring children with you during ultrasound (echo/ vascular) testing. Due to room size and safety concerns, children are not allowed in the ultrasound rooms during exams. Our front office staff cannot provide observation of children in our lobby area while testing is being conducted. An adult accompanying a patient to their appointment will only be allowed in the ultrasound room at the discretion of the ultrasound technician under special circumstances. We apologize for any inconvenience.    ZIO XT- Long Term Monitor Instructions   Your physician has requested you wear your ZIO patch monitor_____14__days.   This is a single patch monitor.  [employer] supplies one patch monitor per enrollment.  Additional stickers are not available.   Please do not apply patch if you will be having a Nuclear Stress Test, Echocardiogram, Cardiac CT, MRI, or Chest Xray during the time frame you would be wearing the monitor. The patch cannot be worn during these tests.  You cannot remove and re-apply the ZIO XT patch monitor.   Your ZIO patch monitor will be sent USPS Priority mail from [facility] directly to your home address. The monitor may also be mailed to a PO BOX  if home delivery is not available.   It may take 3-5 days to receive your monitor after you have been enrolled.   Once you have received you monitor, please review enclosed instructions.  Your monitor has already been registered assigning a specific monitor serial # to you.   Applying the monitor   Shave hair from upper left chest.   Hold abrader disc by orange tab.  Rub abrader in 40 strokes over left upper chest as indicated in your monitor instructions.   Clean area with 4 enclosed alcohol pads .  Use all pads to assure are is cleaned thoroughly.  Let dry.   Apply patch as indicated in monitor instructions.  Patch will be place under collarbone on left side of chest with arrow pointing upward.   Rub patch adhesive wings for 2 minutes.Remove white label marked "1".  Remove white label marked "2".  Rub patch adhesive wings for 2 additional minutes.   While looking in a mirror, press and release button in center of patch.  A small green light will flash 3-4 times .  This will be your only indicator the monitor has been turned on.     Do not shower for the first 24 hours.  You may shower after the first 24 hours.   Press button if you feel a symptom. You will hear a small click.  Record Date, Time and Symptom in the Patient Log Book.   When you are ready to remove patch, follow instructions on last 2 pages of Patient Log Book.  Stick  patch monitor onto last page of Patient Log Book.   Place Patient Log Book in [place] box.  Use locking tab on box and tape box closed securely.  The [employer] and [employer] has [employer] on it.  Please place in mailbox as soon as possible.  Your physician should have your test results approximately 7 days after the monitor has been mailed back to [facility].   Call [facility] Customer Care at [phone] if you have questions regarding your ZIO XT patch monitor.  Call them immediately if you see an orange light blinking on your monitor.   If your  monitor falls off in less than 4 days contact our Monitor department at [phone].  If your monitor becomes loose or falls off after 4 days call [employer] at [phone] for suggestions on securing your monitor.    Follow-Up: 3 months with [place]  Any Other Special Instructions Will Be Listed Below (If Applicable).  If you need a refill on your cardiac medications before your next appointment, please call your pharmacy.

## 2023-10-29 ENCOUNTER — Encounter (HOSPITAL_COMMUNITY)
Admission: RE | Admit: 2023-10-29 | Discharge: 2023-10-29 | Disposition: A | Discharge status: Home / Self Care | Source: Ambulatory Visit | Attending: Optometry | Admitting: Optometry

## 2023-10-29 ENCOUNTER — Other Ambulatory Visit: Payer: Self-pay

## 2023-10-29 ENCOUNTER — Encounter (HOSPITAL_COMMUNITY): Payer: Self-pay

## 2023-10-29 NOTE — Progress Notes (Signed)
   10/29/23 0951  OBSTRUCTIVE SLEEP APNEA  Have you ever been diagnosed with sleep apnea through a sleep study? No  Do you snore loudly (loud enough to be heard through closed doors)?  1  Do you often feel tired, fatigued, or sleepy during the daytime (such as falling asleep during driving or talking to someone)? 0  Has anyone observed you stop breathing during your sleep? 0  Do you have, or are you being treated for high blood pressure? 1  BMI more than 35 kg/m2? 0  Age > 50 (1-yes) 1  Neck circumference greater than:Male 16 inches or larger, Male 17inches or larger? 0  Male Gender (Yes=1) 1  Obstructive Sleep Apnea Score 4  Score 5 or greater  Results sent to PCP

## 2023-11-02 ENCOUNTER — Encounter (HOSPITAL_COMMUNITY): Payer: Self-pay | Admitting: Optometry

## 2023-11-02 ENCOUNTER — Ambulatory Visit (HOSPITAL_COMMUNITY)
Admission: RE | Admit: 2023-11-02 | Discharge: 2023-11-02 | Disposition: A | Discharge status: Home / Self Care | Attending: Optometry | Admitting: Optometry

## 2023-11-02 ENCOUNTER — Other Ambulatory Visit: Payer: Self-pay

## 2023-11-02 ENCOUNTER — Encounter (HOSPITAL_COMMUNITY)
Admission: RE | Disposition: A | Discharge status: Home / Self Care | Payer: Self-pay | Source: Home / Self Care | Attending: Optometry

## 2023-11-02 ENCOUNTER — Ambulatory Visit (HOSPITAL_COMMUNITY): Admitting: Certified Registered"

## 2023-11-02 DIAGNOSIS — H401431 Capsular glaucoma with pseudoexfoliation of lens, bilateral, mild stage: Secondary | ICD-10-CM | POA: Diagnosis not present

## 2023-11-02 DIAGNOSIS — H16223 Keratoconjunctivitis sicca, not specified as Sjogren's, bilateral: Secondary | ICD-10-CM | POA: Insufficient documentation

## 2023-11-02 DIAGNOSIS — H26492 Other secondary cataract, left eye: Secondary | ICD-10-CM | POA: Insufficient documentation

## 2023-11-02 DIAGNOSIS — Z961 Presence of intraocular lens: Secondary | ICD-10-CM | POA: Insufficient documentation

## 2023-11-02 DIAGNOSIS — Z5309 Procedure and treatment not carried out because of other contraindication: Secondary | ICD-10-CM | POA: Diagnosis not present

## 2023-11-02 DIAGNOSIS — F419 Anxiety disorder, unspecified: Secondary | ICD-10-CM | POA: Diagnosis not present

## 2023-11-02 DIAGNOSIS — H2511 Age-related nuclear cataract, right eye: Secondary | ICD-10-CM | POA: Diagnosis not present

## 2023-11-02 DIAGNOSIS — H5711 Ocular pain, right eye: Secondary | ICD-10-CM | POA: Diagnosis present

## 2023-11-02 DIAGNOSIS — R030 Elevated blood-pressure reading, without diagnosis of hypertension: Secondary | ICD-10-CM | POA: Insufficient documentation

## 2023-11-02 SURGERY — PHACOEMULSIFICATION, CATARACT, WITH IOL INSERTION
Anesthesia: Monitor Anesthesia Care | Laterality: Right

## 2023-11-02 MED ORDER — MIDAZOLAM HCL 2 MG/2ML IJ SOLN
INTRAMUSCULAR | Status: AC
  Filled 2023-11-02: qty 2

## 2023-11-02 MED ORDER — PHENYLEPHRINE HCL 2.5 % OP SOLN
1.0000 [drp] | OPHTHALMIC | Status: AC
  Administered 2023-11-02 (×3): 1 [drp] via OPHTHALMIC

## 2023-11-02 MED ORDER — TETRACAINE HCL 0.5 % OP SOLN
1.0000 [drp] | OPHTHALMIC | Status: AC
  Administered 2023-11-02 (×3): 1 [drp] via OPHTHALMIC

## 2023-11-02 MED ORDER — TROPICAMIDE 1 % OP SOLN
1.0000 [drp] | OPHTHALMIC | Status: AC
Start: 2023-11-02 — End: 2023-11-02
  Administered 2023-11-02 (×3): 1 [drp] via OPHTHALMIC

## 2023-11-02 MED ORDER — LIDOCAINE HCL 3.5 % OP GEL: 1.0000 | Freq: Once | OPHTHALMIC | Status: DC

## 2023-11-02 NOTE — OR Nursing (Signed)
 Need cardiac clarence prior to this procedure.Cancelled and to be rescheduled . Dr. Loreda Rodriguez and Dr. Porter Britain both agree to get cardiac clarence.

## 2023-11-02 NOTE — Progress Notes (Signed)
 Dr.Lutz and Dr.Snyder in to see patient and speak with daughter about heart history. Per Dr.Snyder and Dr.Lutz cancel procedure until  cardiac clearance pt and daughter aware. IV removed patient discharged home with daughter

## 2023-11-02 NOTE — Anesthesia Preprocedure Evaluation (Addendum)
 Anesthesia Evaluation  Patient identified by MRN, date of birth, ID band Patient awake    Reviewed: Allergy & Precautions, H&P , NPO status , Patient's Chart, lab work & pertinent test results  Airway Mallampati: II  TM Distance: >3 FB Neck ROM: Full    Dental no notable dental hx.    Pulmonary neg pulmonary ROS   Pulmonary exam normal breath sounds clear to auscultation       Cardiovascular hypertension, Normal cardiovascular exam+ dysrhythmias Atrial Fibrillation  Rhythm:Regular Rate:Normal  Recent episode afib 4/17 Pt has not started Eliquis  yet Patient to have a Holter placed after procedure Currently in SR Pt to reschedule after afib addressed   Neuro/Psych  PSYCHIATRIC DISORDERS  Depression    negative neurological ROS     GI/Hepatic negative GI ROS, Neg liver ROS,,,  Endo/Other  negative endocrine ROS    Renal/GU negative Renal ROS  negative genitourinary   Musculoskeletal negative musculoskeletal ROS (+)    Abdominal   Peds negative pediatric ROS (+)  Hematology negative hematology ROS (+)   Anesthesia Other Findings   Reproductive/Obstetrics negative OB ROS                             Anesthesia Physical Anesthesia Plan  ASA: 3  Anesthesia Plan: MAC   Post-op Pain Management:    Induction:   PONV Risk Score and Plan: Treatment may vary due to age or medical condition  Airway Management Planned: Nasal Cannula  Additional Equipment:   Intra-op Plan:   Post-operative Plan:   Informed Consent: I have reviewed the patients History and Physical, chart, labs and discussed the procedure including the risks, benefits and alternatives for the proposed anesthesia with the patient or authorized representative who has indicated his/her understanding and acceptance.     Dental advisory given  Plan Discussed with: CRNA  Anesthesia Plan Comments:        Anesthesia  Quick Evaluation

## 2023-11-02 NOTE — Interval H&P Note (Addendum)
 History and Physical Interval Note:  11/02/2023 8:27 AM  Patient has had an episode of afib since initial visit and was requested to have holter monitor and starting a blood thinner. Therefore case will be canceled today.    Nicholas Hawkins

## 2023-11-28 ENCOUNTER — Ambulatory Visit (HOSPITAL_COMMUNITY)
Admission: RE | Admit: 2023-11-28 | Discharge: 2023-11-28 | Disposition: A | Discharge status: Home / Self Care | Source: Ambulatory Visit | Attending: Cardiology | Admitting: Cardiology

## 2023-11-28 DIAGNOSIS — I4891 Unspecified atrial fibrillation: Secondary | ICD-10-CM | POA: Diagnosis not present

## 2023-11-28 LAB — ECHOCARDIOGRAM COMPLETE
Area-P 1/2: 1.3 cm2
S' Lateral: 2.65 cm

## 2023-11-29 ENCOUNTER — Ambulatory Visit: Payer: Self-pay | Admitting: Cardiology

## 2023-12-01 DIAGNOSIS — I4891 Unspecified atrial fibrillation: Secondary | ICD-10-CM | POA: Diagnosis not present

## 2023-12-05 ENCOUNTER — Other Ambulatory Visit: Payer: Self-pay | Admitting: Family

## 2023-12-07 DIAGNOSIS — I4891 Unspecified atrial fibrillation: Secondary | ICD-10-CM

## 2023-12-10 NOTE — Telephone Encounter (Signed)
-----   Message from Lynwood Schilling sent at 12/09/2023  4:35 PM EDT ----- Rhythm was normal sinus.  There was a run SVT of 21.4 seconds.  No atrial fib.  No change in therapy.  Call Mr. Dorris with the results and send results to Lavell Bari LABOR, FNP ----- Message ----- From: Schilling Lynwood, MD Sent: 12/07/2023   4:55 PM EDT To: Lynwood Schilling, MD

## 2023-12-10 NOTE — Telephone Encounter (Signed)
 Spoke with pt's daughter (per DPR) regarding her father's results. Daughter was wondering if since no afib was detected with the heart monitor if th pt needs to continue taking Eliquis . Daughter was told her question would be forwarded to Dr. Lavona for his suggestions. Daughter verbalized understanding. All questions if any were answered.

## 2023-12-21 ENCOUNTER — Ambulatory Visit: Payer: Self-pay

## 2023-12-21 NOTE — Telephone Encounter (Signed)
 Appointment scheduled.

## 2023-12-21 NOTE — Telephone Encounter (Signed)
 FYI Only or Action Required?: Action required by provider: request for appointment.  Patient was last seen in primary care on 10/09/2023 by Lavell Bari LABOR, FNP.  Called Nurse Triage reporting Dizziness.  Symptoms began today.  Interventions attempted: Rest, hydration, or home remedies.  Symptoms are: stable.  Triage Disposition: See PCP When Office is Open (Within 3 Days)  Patient/caregiver understands and will follow disposition?: YesCopied from CRM [id number]. Topic: Clinical - Red Word Triage >> Dec 21, 2023 11:37 AM Tobias CROME wrote: Red Word that prompted transfer to Nurse Triage: Patient has been dizzy and doesn't feel right. Reason for Disposition  [1] MILD dizziness (e.g., walking normally) AND [2] has NOT been evaluated by doctor (or NP/PA) for this  (Exception: Dizziness caused by heat exposure, sudden standing, or poor fluid intake.)  Answer Assessment - Initial Assessment Questions 1. DESCRIPTION: Describe your dizziness.     Kinda dizzy  4. SEVERITY: How bad is it?  Do you feel like you are going to faint? Can you stand and walk?     yes 5. ONSET:  When did the dizziness begin?     After increase of BP medication  6. AGGRAVATING FACTORS: Does anything make it worse? (e.g., standing, change in head position)     Na  7. HEART RATE: Can you tell me your heart rate? How many beats in 15 seconds?  (Note: Not all patients can do this.)       Na  8. CAUSE: What do you think is causing the dizziness? (e.g., decreased fluids or food, diarrhea, emotional distress, heat exposure, new medicine, sudden standing, vomiting; unknown)     BP medication adjustment   10. OTHER SYMPTOMS: Do you have any other symptoms? (e.g., fever, chest pain, vomiting, diarrhea, bleeding)       Headache and neck pain  at times  Daughter Mrs Early to have BP checked after increase in dosage. Pt has dizziness here and there.  Pt has had no falls. More of a follow up. No real concerns  at this time.  Protocols used: Dizziness - Lightheadedness-A-AH

## 2023-12-24 ENCOUNTER — Ambulatory Visit (INDEPENDENT_AMBULATORY_CARE_PROVIDER_SITE_OTHER): Admitting: Family Medicine

## 2023-12-24 ENCOUNTER — Encounter: Payer: Self-pay | Admitting: Family Medicine

## 2023-12-24 VITALS — BP 125/66 | HR 70 | Temp 98.4°F | Ht 72.0 in | Wt 205.6 lb

## 2023-12-24 DIAGNOSIS — R42 Dizziness and giddiness: Secondary | ICD-10-CM | POA: Diagnosis not present

## 2023-12-24 DIAGNOSIS — I10 Essential (primary) hypertension: Secondary | ICD-10-CM

## 2023-12-24 DIAGNOSIS — I4891 Unspecified atrial fibrillation: Secondary | ICD-10-CM | POA: Insufficient documentation

## 2023-12-24 DIAGNOSIS — J0140 Acute pansinusitis, unspecified: Secondary | ICD-10-CM | POA: Diagnosis not present

## 2023-12-24 MED ORDER — FLUTICASONE PROPIONATE 50 MCG/ACT NA SUSP: 2.0000 | Freq: Every day | NASAL | 6 refills | Status: AC

## 2023-12-24 MED ORDER — LISINOPRIL 10 MG PO TABS: 10.0000 mg | ORAL_TABLET | Freq: Every day | ORAL | 3 refills | Status: AC

## 2023-12-24 MED ORDER — AMOXICILLIN 875 MG PO TABS: 875.0000 mg | ORAL_TABLET | Freq: Two times a day (BID) | ORAL | 0 refills | Status: AC

## 2023-12-24 NOTE — Progress Notes (Signed)
 Acute Office Visit  Subjective:     Patient ID: Nicholas Hawkins, male    DOB: November 13, 1935, 88 y.o.   MRN: [id number]  Chief Complaint  Patient presents with   Dizziness   Facial Pain    HPI Patient is in today for dizziness. This started after PCP increase lisinopril  from 10 mg to 20 mg back in April. Ted decreased the dosage back to 10 mg 3-4 weeks ago and the dizziness has since resolved. He has been monitoring his BP at home and it has been well controlled with the 10 mg dosage. Home BP has been 110-120s/60-70s. Denies chest pain, shortness of breath, edema, focal weakness. He does have a history of a. Fib. Established with cardiology and was recommended to continue eliquis . He has not been taking this.   He also reports sinus pressure and drainage for the last year, worse over the last 2-3 weeks. Also reports nasal congestion, tenderness around cheeks, forehead. Denies fever, sore throat, cough, ear pain. He used a nasal spray with mild improvement.   ROS As per HPI.     Objective:    BP 125/66   Pulse 70   Temp 98.4 F (36.9 C) (Temporal)   Ht 6' (1.829 m)   Wt 205 lb 9.6 oz (93.3 kg)   SpO2 96%   BMI 27.88 kg/m    Physical Exam Vitals and nursing note reviewed.  Constitutional:      General: He is not in acute distress.    Appearance: Normal appearance. He is not ill-appearing, toxic-appearing or diaphoretic.  HENT:     Right Ear: Tympanic membrane, ear canal and external ear normal.     Left Ear: Tympanic membrane and ear canal normal.     Nose: Congestion present.     Right Sinus: Maxillary sinus tenderness and frontal sinus tenderness present.     Left Sinus: Maxillary sinus tenderness and frontal sinus tenderness present.     Mouth/Throat:     Mouth: Mucous membranes are moist.     Pharynx: Oropharynx is clear. No oropharyngeal exudate or posterior oropharyngeal erythema.     Tonsils: No tonsillar exudate or tonsillar abscesses. 0 on the right. 0 on the  left.  Eyes:     General:        Right eye: No discharge.        Left eye: No discharge.     Extraocular Movements: Extraocular movements intact.     Conjunctiva/sclera: Conjunctivae normal.     Pupils: Pupils are equal, round, and reactive to light.  Cardiovascular:     Rate and Rhythm: Normal rate and regular rhythm.     Heart sounds: Normal heart sounds. No murmur heard. Pulmonary:     Effort: Pulmonary effort is normal. No respiratory distress.     Breath sounds: Normal breath sounds. No wheezing.  Musculoskeletal:     Cervical back: Neck supple. No rigidity.     Right lower leg: No edema.     Left lower leg: No edema.  Lymphadenopathy:     Cervical: No cervical adenopathy.  Skin:    General: Skin is warm and dry.  Neurological:     Mental Status: He is alert and oriented to person, place, and time. Mental status is at baseline.  Psychiatric:        Mood and Affect: Mood normal.        Behavior: Behavior normal.     No results found for any visits on 12/24/23.  Assessment & Plan:   Ahan Eisenberger was seen today for dizziness and facial pain.  Diagnoses and all orders for this visit:  Dizziness Resolved with decreased dosage of lisinopril .   Primary hypertension BP at goal. Continue lisinopril  10 mg daily.  -     lisinopril  (ZESTRIL ) 10 MG tablet; Take 1 tablet (10 mg total) by mouth daily.  Atrial fibrillation, unspecified type (HCC) Discussed compliance with eliqus. RRR today on exam.   Acute non-recurrent pansinusitis Amoxicillin  as below. Flonase . Discussed symptomatic care and return precautions.  -     amoxicillin  (AMOXIL ) 875 MG tablet; Take 1 tablet (875 mg total) by mouth 2 (two) times daily for 7 days. -     fluticasone  (FLONASE ) 50 MCG/ACT nasal spray; Place 2 sprays into both nostrils daily.   Return if symptoms worsen or fail to improve. Keep scheduled follow up with PCP.   The patient indicates understanding of these issues and agrees with  the plan.  Annabella CHRISTELLA Search, FNP

## 2024-01-22 ENCOUNTER — Ambulatory Visit

## 2024-01-24 NOTE — Progress Notes (Signed)
 Cardiology Office Note    Date:  01/25/2024  ID:  Nicholas Hawkins, DOB 12-20-1935, MRN [id number] Cardiologist: Lynwood Schilling, MD Cardiology APP:  Johnson Laymon HERO, PA-C { :  History of Present Illness:    Nicholas Hawkins is a 88 y.o. male with past medical history of paroxysmal atrial fibrillation, HTN and RBBB who presents to the office today for 49-month follow-up.  He was examined by Dr. Schilling in 10/2023 as a new patient referral after recent EKG had shown new-onset atrial fibrillation. He reported having a warm sensation along his chest but no definitive chest pain or palpitations.  He had been taking ASA 81 mg daily and given his CHA2DS2-VASc score of 3, this was stopped and switched to Eliquis  5 mg twice daily. A 2-week monitor was recommended to assess his burden and also to obtain a follow-up echocardiogram. His echocardiogram showed a preserved EF of 60 to 65% with no regional wall motion abnormalities. He did have mild LVH, normal RV function, mild MR and mild AI. His event monitor showed predominantly normal sinus rhythm with occasional episodes of SVT with the longest lasting for 21.4 seconds. He did have PAC's 9.4% of the time but no sustained arrhythmias.  In talking with the patient and his daughter today, he reports overall doing well since his last office visit. He remains active in doing routine chores around his home and also enjoys doing yard work and gardening. He has a walking trail around his property and walks this for exercise. Denies any recent chest pain or dyspnea on exertion.  No recent palpitations and says he was overall asymptomatic with he has episode of atrial fibrillation previously.  No recent orthopnea, PND or pitting edema. He does consume approximately 3 to 4 cups of coffee a day and family has encouraged him to consume more water. No alcohol use. He remains on Eliquis  for anticoagulation with no reports of melena, hematochezia or hematuria.  Studies  Reviewed:   EKG: EKG is not ordered today.  Echocardiogram: 11/2023 IMPRESSIONS     1. Left ventricular ejection fraction, by estimation, is 60 to 65%. The  left ventricle has normal function. The left ventricle has no regional  wall motion abnormalities. There is mild concentric left ventricular  hypertrophy. Left ventricular diastolic  parameters are indeterminate.   2. Right ventricular systolic function is normal. The right ventricular  size is normal. Mildly increased right ventricular wall thickness.  Tricuspid regurgitation signal is inadequate for assessing PA pressure.   3. The mitral valve is degenerative. Mild mitral valve regurgitation.  Moderate mitral annular calcification.   4. The aortic valve is tricuspid. There is mild calcification of the  aortic valve. Aortic valve regurgitation is mild. Aortic valve  sclerosis/calcification is present, without any evidence of aortic  stenosis.   5. The inferior vena cava is normal in size with greater than 50%  respiratory variability, suggesting right atrial pressure of 3 mmHg.   Comparison(s): No prior Echocardiogram.   Event Monitor: 11/2023 Predominant rhythm was normal sinus. Occasional supraventricular tachycardia with the longest run lasting 21.4 seconds.  Supraventricular ectopy constituted 9.4% of beats. Rare ventricular ectopy with no sustained arrhythmias.  Risk Assessment/Calculations:    CHA2DS2-VASc Score = 3  This indicates a 3.2% annual risk of stroke. The patient's score is based upon: CHF History: 0 HTN History: 1 Diabetes History: 0 Stroke History: 0 Vascular Disease History: 0 Age Score: 2 Gender Score: 0     Physical Exam:  VS:  BP 122/62 (BP Location: Right Arm, Cuff Size: Normal)   Pulse 70   Ht 6' (1.829 m)   Wt 206 lb 9.6 oz (93.7 kg)   SpO2 95%   BMI 28.02 kg/m    Wt Readings from Last 3 Encounters:  01/25/24 206 lb 9.6 oz (93.7 kg)  12/24/23 205 lb 9.6 oz (93.3 kg)  11/02/23  208 lb (94.3 kg)     GEN: Pleasant, elderly male appearing in no acute distress NECK: No JVD; No carotid bruits CARDIAC: RRR, no murmurs, rubs, gallops RESPIRATORY:  Clear to auscultation without rales, wheezing or rhonchi  ABDOMEN: Appears non-distended. No obvious abdominal masses. EXTREMITIES: No clubbing or cyanosis. No pitting edema.  Distal pedal pulses are 2+ bilaterally.   Assessment and Plan:   1. PAF (paroxysmal atrial fibrillation) (HCC) - Diagnosed by EKG in 09/2023 and follow-up monitor showed predominantly normal sinus rhythm with brief episodes of SVT and he did have PAC's 9.4% of the time. He denies any recent palpitations and heart rate was also well-controlled in the 60's when previously in atrial fibrillation. Has not required the use of AV nodal blocking agents. Reviewed that reducing caffeine consumption could help with his PAC burden.   2. Current use of long term anticoagulation - He is on Eliquis  5 mg twice daily for anticoagulation which is the appropriate dose given his current age, weight and renal function (creatinine at 0.98 when checked in 09/2023). CBC in 09/2023 showed his hemoglobin was stable at 15.6 with platelets at 189 K. Will provide with lab slips for repeat CBC and BMET.  - He reports Eliquis  was expensive with his insurance but possibly due to having not yet met his deductible.  Informed him he could check with insurance to see if Xarelto has better coverage and we could switch if indicated.  Otherwise, anticipate cost will improve once his deductible has been met. Also reviewed that we could switch to Coumadin if needed due to cost but this would have a higher bleeding risk.  3. Essential hypertension - BP is well-controlled at 122/62 during today's visit. Continue current medical therapy with Lisinopril  10 mg daily.  4. MR/AI - Echocardiogram in 11/2023 showed mild MR and mild AI. Can plan for repeat imaging in a few years for reassessment unless he  develops new symptoms in the interim.   Signed, Laymon CHRISTELLA Qua, PA-C

## 2024-01-25 ENCOUNTER — Encounter: Payer: Self-pay | Admitting: Student

## 2024-01-25 ENCOUNTER — Ambulatory Visit: Attending: Student | Admitting: Student

## 2024-01-25 VITALS — BP 122/62 | HR 70 | Ht 72.0 in | Wt 206.6 lb

## 2024-01-25 DIAGNOSIS — I480 Paroxysmal atrial fibrillation: Secondary | ICD-10-CM | POA: Diagnosis not present

## 2024-01-25 DIAGNOSIS — I10 Essential (primary) hypertension: Secondary | ICD-10-CM

## 2024-01-25 DIAGNOSIS — Z79899 Other long term (current) drug therapy: Secondary | ICD-10-CM

## 2024-01-25 DIAGNOSIS — Z7901 Long term (current) use of anticoagulants: Secondary | ICD-10-CM | POA: Diagnosis not present

## 2024-01-25 DIAGNOSIS — I351 Nonrheumatic aortic (valve) insufficiency: Secondary | ICD-10-CM

## 2024-01-25 NOTE — Patient Instructions (Signed)
 Can check with insurance to see if Xarelto has better coverage.   Medication Instructions:  Your physician recommends that you continue on your current medications as directed. Please refer to the Current Medication list given to you today.  *If you need a refill on your cardiac medications before your next appointment, please call your pharmacy*  Lab Work: Your physician recommends that you return for lab work. ( BMET, CBC)   If you have labs (blood work) drawn today and your tests are completely normal, you will receive your results only by: MyChart Message (if you have MyChart) OR A paper copy in the mail If you have any lab test that is abnormal or we need to change your treatment, we will call you to review the results.  Testing/Procedures: NONE   Follow-Up: At [facility], you and your health needs are our priority.  As part of our continuing mission to provide you with exceptional heart care, our providers are all part of one team.  This team includes your primary Cardiologist (physician) and Advanced Practice Providers or APPs (Physician Assistants and Nurse Practitioners) who all work together to provide you with the care you need, when you need it.  Your next appointment:   6 month(s)  Provider:   You may see Lynwood Schilling, MD or one of the following Advanced Practice Providers on your designated Care Team:   Laymon Qua, PA-C  Sc[place]ia [place], [place] Olivia Pavy, [place]     We recommend signing up for the patient portal called MyChart.  Sign up information is provided on this After Visit Summary.  MyChart is used to connect with patients for Virtual Visits (Telemedicine).  Patients are able to view lab/test results, encounter notes, upcoming appointments, etc.  Non-urgent messages can be sent to your provider as well.   To learn more about what you can do with MyChart, go to [email or web address].   Other Instructions Thank you for choosing   [facility]!

## 2024-02-19 ENCOUNTER — Other Ambulatory Visit: Payer: Self-pay | Admitting: Family

## 2024-02-19 DIAGNOSIS — F5101 Primary insomnia: Secondary | ICD-10-CM

## 2024-04-01 ENCOUNTER — Ambulatory Visit

## 2024-04-08 DIAGNOSIS — D485 Neoplasm of uncertain behavior of skin: Secondary | ICD-10-CM | POA: Diagnosis not present

## 2024-04-08 DIAGNOSIS — L570 Actinic keratosis: Secondary | ICD-10-CM | POA: Diagnosis not present

## 2024-04-10 ENCOUNTER — Encounter: Payer: Self-pay | Admitting: Cardiology

## 2024-04-10 ENCOUNTER — Ambulatory Visit

## 2024-04-10 VITALS — BP 122/62 | HR 70 | Ht 73.0 in | Wt 206.0 lb

## 2024-04-10 DIAGNOSIS — Z0000 Encounter for general adult medical examination without abnormal findings: Secondary | ICD-10-CM

## 2024-04-10 NOTE — Patient Instructions (Signed)
 [name],  Thank you for taking the time for your Medicare Wellness Visit. I appreciate your continued commitment to your health goals. Please review the care plan we discussed, and feel free to reach out if I can assist you further.  Medicare recommends these wellness visits once per year to help you and your care team stay ahead of potential health issues. These visits are designed to focus on prevention, allowing your provider to concentrate on managing your acute and chronic conditions during your regular appointments.  Please note that Annual Wellness Visits do not include a physical exam. Some assessments may be limited, especially if the visit was conducted virtually. If needed, we may recommend a separate in-person follow-up with your provider.  Ongoing Care Seeing your primary care provider every 3 to 6 months helps us  monitor your health and provide consistent, personalized care.   Referrals If a referral was made during today's visit and you haven't received any updates within two weeks, please contact the referred provider directly to check on the status.  Recommended Screenings:  Health Maintenance  Topic Date Due   COVID-19 Vaccine (1) Never done   Medicare Annual Wellness Visit  09/04/2023   Flu Shot  01/11/2024   DTaP/Tdap/Td vaccine (4 - Td or Tdap) 09/26/2033   Pneumococcal Vaccine for age over 15  Completed   Zoster (Shingles) Vaccine  Completed   Meningitis B Vaccine  Aged Out       04/10/2024   10:21 AM  Advanced Directives  Does Patient Have a Medical Advance Directive? No   Advance Care Planning is important because it: Ensures you receive medical care that aligns with your values, goals, and preferences. Provides guidance to your family and loved ones, reducing the emotional burden of decision-making during critical moments.  Vision: Annual vision screenings are recommended for early detection of glaucoma, cataracts, and diabetic retinopathy. These exams  can also reveal signs of chronic conditions such as diabetes and high blood pressure.  Dental: Annual dental screenings help detect early signs of oral cancer, gum disease, and other conditions linked to overall health, including heart disease and diabetes.  Please see the attached documents for additional preventive care recommendations.

## 2024-04-10 NOTE — Progress Notes (Signed)
 Subjective:   Nicholas Hawkins is a 88 y.o. who presents for a Medicare Wellness preventive visit.  As a reminder, Annual Wellness Visits don't include a physical exam, and some assessments may be limited, especially if this visit is performed virtually. We may recommend an in-person follow-up visit with your provider if needed.  Visit Complete: Virtual I connected with  [name] on 04/10/24 by a audio enabled telemedicine application and verified that I am speaking with the correct person using two identifiers.  Patient Location: Home  Provider Location: Home Office  I discussed the limitations of evaluation and management by telemedicine. The patient expressed understanding and agreed to proceed.  Vital Signs: Because this visit was a virtual/telehealth visit, some criteria may be missing or patient reported. Any vitals not documented were not able to be obtained and vitals that have been documented are patient reported.  VideoDeclined- This patient declined Librarian, academic. Therefore the visit was completed with audio only.  Persons Participating in Visit: Patient.  AWV Questionnaire: No: Patient Medicare AWV questionnaire was not completed prior to this visit.  Cardiac Risk Factors include: advanced age (>30men, >41 women);diabetes mellitus;hypertension     Objective:    Today's Vitals   04/10/24 1003  BP: 122/62  Pulse: 70  Weight: 206 lb (93.4 kg)  Height: 6' 1 (1.854 m)   Body mass index is 27.18 kg/m.     04/10/2024   10:21 AM 11/02/2023    7:59 AM 10/29/2023    9:47 AM 09/04/2022    2:45 PM 05/27/2022   12:04 PM 07/11/2021   10:12 AM 08/27/2017    3:15 PM  Advanced Directives  Does Patient Have a Medical Advance Directive? No No No No Yes Yes No   Type of Agricultural Consultant;Living will Healthcare Power of [place];Living will   Copy of Healthcare Power of Attorney in Chart?      No - copy  requested   Would patient like information on creating a medical advance directive?  No - Patient declined No - Patient declined No - Patient declined   No - Patient declined      Data saved with a previous flowsheet row definition    Current Medications (verified) Outpatient Encounter Medications as of 04/10/2024  Medication Sig   apixaban  (ELIQUIS ) 5 MG TABS tablet Take 1 tablet (5 mg total) by mouth 2 (two) times daily.   Colchicine  0.6 MG CAPS Take 0.6 mg by mouth twice a day for 4 days and then STOP   fluticasone  (FLONASE ) 50 MCG/ACT nasal spray Place 2 sprays into both nostrils daily.   ketoconazole  (NIZORAL ) 2 % cream Apply 1 application. topically daily.   lisinopril  (ZESTRIL ) 10 MG tablet Take 1 tablet (10 mg total) by mouth daily.   PARoxetine  (PAXIL ) 40 MG tablet Take 1 tablet (40 mg total) by mouth every morning.   traZODone  (DESYREL ) 50 MG tablet TAKE 1-2 TABLETS BY MOUTH AT BEDTIME   No facility-administered encounter medications on file as of 04/10/2024.    Allergies (verified) Patient has no known allergies.   History: Past Medical History:  Diagnosis Date   Hyperlipidemia    HYPERTENSION, MILD    Low testosterone     Obesity, unspecified    Past Surgical History:  Procedure Laterality Date   APPENDECTOMY     KNEE SURGERY Right    Family History  Problem Relation Age of Onset   Heart disease  Mother    Early death Sister    Aneurysm Brother    Social History   Socioeconomic History   Marital status: Married    Spouse name: Not on file   Number of children: 2   Years of education: Not on file   Highest education level: 11th grade  Occupational History   Not on file  Tobacco Use   Smoking status: Never   Smokeless tobacco: Never  Vaping Use   Vaping status: Never Used  Substance and Sexual Activity   Alcohol use: No   Drug use: No   Sexual activity: Never  Other Topics Concern   Not on file  Social History Narrative   Lives with wife.   Logger.     Social Drivers of Corporate Investment Banker Strain: Low Risk  (04/10/2024)   Overall Financial Resource Strain (CARDIA)    Difficulty of Paying Living Expenses: Not hard at all  Food Insecurity: No Food Insecurity (04/10/2024)   Hunger Vital Sign    Worried About Running Out of Food in the Last Year: Never true    Ran Out of Food in the Last Year: Never true  Transportation Needs: No Transportation Needs (04/10/2024)   PRAPARE - Administrator, Civil Service (Medical): No    Lack of Transportation (Non-Medical): No  Physical Activity: Sufficiently Active (04/10/2024)   Exercise Vital Sign    Days of Exercise per Week: 5 days    Minutes of Exercise per Session: 30 min  Stress: No Stress Concern Present (04/10/2024)   [employer] of Occupational Health - Occupational Stress Questionnaire    Feeling of Stress: Not at all  Social Connections: Moderately Isolated (04/10/2024)   Social Connection and Isolation Panel    Frequency of Communication with Friends and Family: More than three times a week    Frequency of Social Gatherings with Friends and Family: More than three times a week    Attends Religious Services: Never    Database Administrator or Organizations: No    Attends Engineer, Structural: Never    Marital Status: Married    Tobacco Counseling Counseling given: Yes    Clinical Intake:  Pre-visit preparation completed: Yes  Pain : No/denies pain     BMI - recorded: 27.18 Nutritional Status: BMI 25 -29 Overweight Nutritional Risks: None Diabetes: No  Lab Results  Component Value Date   HGBA1C 5.5 09/27/2023   HGBA1C 5.4 09/20/2017   HGBA1C 6.2 03/22/2017     How often do you need to have someone help you when you read instructions, pamphlets, or other written materials from your doctor or pharmacy?: 1 - Never  Interpreter Needed?: No  Information entered by :: alia t/cma   Activities of Daily Living      04/10/2024   10:07 AM 11/02/2023    8:05 AM  In your present state of health, do you have any difficulty performing the following activities:  Hearing? 1 1  Vision? 1 1  Difficulty concentrating or making decisions? 0 0  Walking or climbing stairs? 0   Dressing or bathing? 0   Doing errands, shopping? 0   Comment only drives to dr office   Preparing Food and eating ? N   Using the Toilet? N   In the past six months, have you accidently leaked urine? N   Do you have problems with loss of bowel control? N   Managing your Medications? N   Managing  your Finances? N   Housekeeping or managing your Housekeeping? N     Patient Care Team: Lavell Bari LABOR, FNP as PCP - General (Family Medicine) Lavona Agent, MD as PCP - Cardiology (Cardiology) Johnson Laymon HERO, PA-C as Physician Assistant (Cardiology)  I have updated your Care Teams any recent Medical Services you may have received from other providers in the past year.     Assessment:   This is a routine wellness examination for [facility].  Hearing/Vision screen Hearing Screening - Comments:: Pt have hearing dif Vision Screening - Comments:: Pt's r-eye is blurry/last ov 2024   Goals Addressed             This Visit's Progress    Exercise 3x per week (30 min per time)   On track      Depression Screen     04/10/2024   10:16 AM 12/24/2023   10:11 AM 09/27/2023    9:55 AM 09/04/2022    2:44 PM 11/08/2021    8:59 AM 08/23/2021    9:55 AM 07/26/2021    9:38 AM  PHQ 2/9 Scores  PHQ - 2 Score 0 0 0 0 0 0 0  PHQ- 9 Score  4 0  1 0     Fall Risk     04/10/2024   10:05 AM 12/24/2023   10:11 AM 09/04/2022    2:43 PM 11/08/2021    8:58 AM 08/23/2021    9:55 AM  Fall Risk   Falls in the past year? 0 0 0 1 1  Number falls in past yr: 0  0 0   Injury with Fall? 0  0 0   Risk for fall due to : No Fall Risks  No Fall Risks History of fall(s)   Follow up Falls evaluation completed;Education provided  Falls prevention discussed  Falls evaluation completed       Data saved with a previous flowsheet row definition    MEDICARE RISK AT HOME:  Medicare Risk at Home Any stairs in or around the home?: No If so, are there any without handrails?: No Home free of loose throw rugs in walkways, pet beds, electrical cords, etc?: Yes Adequate lighting in your home to reduce risk of falls?: Yes Life alert?: No Use of a cane, walker or w/c?: No Grab bars in the bathroom?: Yes Shower chair or bench in shower?: Yes Elevated toilet seat or a handicapped toilet?: No  TIMED UP AND GO:  Was the test performed?  no  Cognitive Function: 6CIT completed    05/09/2017    4:25 PM  MMSE - Mini Mental State Exam  Not completed: Unable to complete        04/10/2024   10:19 AM 09/04/2022    2:45 PM 07/11/2021   10:19 AM  6CIT Screen  What Year? 0 points 0 points 0 points  What month? 3 points 0 points 0 points  What time? 0 points 0 points 0 points  Count back from 20 0 points 0 points 0 points  Months in reverse 4 points 0 points 4 points  Repeat phrase 10 points 2 points 10 points  Total Score 17 points 2 points 14 points    Immunizations Immunization History  Administered Date(s) Administered   Fluad Quad(high Dose 65+) 04/29/2019, 03/30/2020, 05/02/2021, 05/12/2022   INFLUENZA, HIGH DOSE SEASONAL PF 03/22/2017, 03/06/2018   Influenza,inj,Quad PF,6+ Mos 03/06/2013, 04/07/2014, 04/30/2015, 03/22/2016   Influenza-Unspecified 03/30/2009, 04/02/2012   Pneumococcal Conjugate-13 07/14/2013   Pneumococcal  Polysaccharide-23 03/30/2020   Pneumococcal-Unspecified 06/12/2005   Td 10/03/2012   Tdap 10/03/2012, 09/27/2023   Zoster Recombinant(Shingrix ) 04/29/2019, 07/26/2021    Screening Tests Health Maintenance  Topic Date Due   COVID-19 Vaccine (1) Never done   Influenza Vaccine  01/11/2024   Medicare Annual Wellness (AWV)  04/10/2025   DTaP/Tdap/Td (4 - Td or Tdap) 09/26/2033   Pneumococcal Vaccine: 50+ Years   Completed   Zoster Vaccines- Shingrix   Completed   Meningococcal B Vaccine  Aged Out    Health Maintenance Items Addressed: See Nurse Notes at the end of this note  Additional Screening:  Vision Screening: Recommended annual ophthalmology exams for early detection of glaucoma and other disorders of the eye. Is the patient up to date with their annual eye exam?  Yes  Who is the provider or what is the name of the office in which the patient attends annual eye exams? N/A  Dental Screening: Recommended annual dental exams for proper oral hygiene  Community Resource Referral / Chronic Care Management: CRR required this visit?  No   CCM required this visit?  No   Plan:    I have personally reviewed and noted the following in the patient's chart:   Medical and social history Use of alcohol, tobacco or illicit drugs  Current medications and supplements including opioid prescriptions. Patient is not currently taking opioid prescriptions. Functional ability and status Nutritional status Physical activity Advanced directives List of other physicians Hospitalizations, surgeries, and ER visits in previous 12 months Vitals Screenings to include cognitive, depression, and falls Referrals and appointments  In addition, I have reviewed and discussed with patient certain preventive protocols, quality metrics, and best practice recommendations. A written personalized care plan for preventive services as well as general preventive health recommendations were provided to patient.   Ozie Ned, CMA   04/10/2024   After Visit Summary: (MyChart) Due to this being a telephonic visit, the after visit summary with patients personalized plan was offered to patient via MyChart   Notes: Nothing significant to report at this time.

## 2024-04-24 DIAGNOSIS — C44622 Squamous cell carcinoma of skin of right upper limb, including shoulder: Secondary | ICD-10-CM | POA: Diagnosis not present

## 2024-05-09 ENCOUNTER — Other Ambulatory Visit: Payer: Self-pay | Admitting: Family
